# Patient Record
Sex: Male | Born: 1955
Health system: Southern US, Community
[De-identification: ages and names within clinical notes are randomized; demographics above are authoritative.]

## PROBLEM LIST (undated history)

## (undated) DIAGNOSIS — Z9889 Other specified postprocedural states: Secondary | ICD-10-CM

## (undated) DIAGNOSIS — J189 Pneumonia, unspecified organism: Secondary | ICD-10-CM

## (undated) DIAGNOSIS — Z8709 Personal history of other diseases of the respiratory system: Secondary | ICD-10-CM

## (undated) DIAGNOSIS — Z860100 Personal history of colon polyps, unspecified: Secondary | ICD-10-CM

## (undated) DIAGNOSIS — T7840XA Allergy, unspecified, initial encounter: Secondary | ICD-10-CM

## (undated) DIAGNOSIS — E785 Hyperlipidemia, unspecified: Secondary | ICD-10-CM

## (undated) DIAGNOSIS — R112 Nausea with vomiting, unspecified: Secondary | ICD-10-CM

## (undated) DIAGNOSIS — I10 Essential (primary) hypertension: Secondary | ICD-10-CM

## (undated) DIAGNOSIS — K219 Gastro-esophageal reflux disease without esophagitis: Secondary | ICD-10-CM

## (undated) HISTORY — PX: POLYPECTOMY: SHX149

## (undated) HISTORY — PX: COLONOSCOPY: SHX174

## (undated) HISTORY — PX: TONSILLECTOMY: SUR1361

## (undated) HISTORY — DX: Essential (primary) hypertension: I10

## (undated) HISTORY — DX: Hyperlipidemia, unspecified: E78.5

## (undated) SURGERY — VIDEO BRONCHOSCOPY WITHOUT FLUORO
Anesthesia: General

---

## 1996-02-16 HISTORY — PX: ELBOW FRACTURE SURGERY: SHX616

## 1998-01-15 ENCOUNTER — Emergency Department (HOSPITAL_COMMUNITY): Admission: EM | Admit: 1998-01-15 | Discharge: 1998-01-15 | Payer: Self-pay | Admitting: Emergency Medicine

## 1998-01-15 ENCOUNTER — Encounter: Payer: Self-pay | Admitting: Emergency Medicine

## 1998-08-02 ENCOUNTER — Encounter: Payer: Self-pay | Admitting: *Deleted

## 1998-08-02 ENCOUNTER — Emergency Department (HOSPITAL_COMMUNITY): Admission: EM | Admit: 1998-08-02 | Discharge: 1998-08-02 | Payer: Self-pay | Admitting: Emergency Medicine

## 1999-01-05 ENCOUNTER — Encounter: Admission: RE | Admit: 1999-01-05 | Discharge: 1999-01-05 | Payer: Self-pay | Admitting: Neurosurgery

## 1999-09-21 ENCOUNTER — Ambulatory Visit (HOSPITAL_COMMUNITY): Admission: RE | Admit: 1999-09-21 | Discharge: 1999-09-21 | Payer: Self-pay | Admitting: Neurosurgery

## 1999-09-21 ENCOUNTER — Encounter: Payer: Self-pay | Admitting: Neurosurgery

## 1999-10-05 ENCOUNTER — Encounter: Payer: Self-pay | Admitting: Neurosurgery

## 1999-10-05 ENCOUNTER — Ambulatory Visit (HOSPITAL_COMMUNITY): Admission: RE | Admit: 1999-10-05 | Discharge: 1999-10-05 | Payer: Self-pay | Admitting: Neurosurgery

## 1999-10-20 ENCOUNTER — Ambulatory Visit (HOSPITAL_COMMUNITY): Admission: RE | Admit: 1999-10-20 | Discharge: 1999-10-20 | Payer: Self-pay | Admitting: Neurosurgery

## 1999-10-20 ENCOUNTER — Encounter: Payer: Self-pay | Admitting: Neurosurgery

## 2000-11-06 ENCOUNTER — Encounter: Payer: Self-pay | Admitting: Neurosurgery

## 2000-11-06 ENCOUNTER — Ambulatory Visit (HOSPITAL_COMMUNITY): Admission: RE | Admit: 2000-11-06 | Discharge: 2000-11-06 | Payer: Self-pay | Admitting: Neurosurgery

## 2004-03-13 ENCOUNTER — Ambulatory Visit: Payer: Self-pay | Admitting: Internal Medicine

## 2004-03-26 ENCOUNTER — Ambulatory Visit: Payer: Self-pay | Admitting: Internal Medicine

## 2008-02-16 HISTORY — PX: ROTATOR CUFF REPAIR: SHX139

## 2008-08-09 ENCOUNTER — Encounter: Admission: RE | Admit: 2008-08-09 | Discharge: 2008-08-09 | Payer: Self-pay | Admitting: Orthopaedic Surgery

## 2009-03-21 ENCOUNTER — Encounter (INDEPENDENT_AMBULATORY_CARE_PROVIDER_SITE_OTHER): Payer: Self-pay | Admitting: *Deleted

## 2009-10-08 ENCOUNTER — Encounter (INDEPENDENT_AMBULATORY_CARE_PROVIDER_SITE_OTHER): Payer: Self-pay | Admitting: *Deleted

## 2009-10-08 ENCOUNTER — Telehealth: Payer: Self-pay | Admitting: Internal Medicine

## 2009-11-20 ENCOUNTER — Encounter (INDEPENDENT_AMBULATORY_CARE_PROVIDER_SITE_OTHER): Payer: Self-pay | Admitting: *Deleted

## 2009-11-21 ENCOUNTER — Ambulatory Visit: Payer: Self-pay | Admitting: Internal Medicine

## 2009-12-05 ENCOUNTER — Ambulatory Visit: Payer: Self-pay | Admitting: Internal Medicine

## 2009-12-05 HISTORY — PX: COLONOSCOPY: SHX174

## 2009-12-10 ENCOUNTER — Encounter: Payer: Self-pay | Admitting: Internal Medicine

## 2010-03-17 NOTE — Procedures (Signed)
Summary: Colonoscopy  Patient: Robert Morrison Note: All result statuses are Final unless otherwise noted.  Tests: (1) Colonoscopy (COL)   COL Colonoscopy           DONE     [facility]     [address]     [place], [place]  [address]           COLONOSCOPY PROCEDURE REPORT           PATIENT:  [name], [name]  MR#:  [id number]     BIRTHDATE:  [birth year], 53 yrs. old  GENDER:  male     ENDOSCOPIST:  Wilhemina Bonito. Eda Keys, MD     REF. BY:  Surveillance Program Recall,     PROCEDURE DATE:  12/05/2009     PROCEDURE:  Colonoscopy with snare polypectomy x 2     ASA CLASS:  Class I     INDICATIONS:  history of pre-cancerous (adenomatous) colon polyps,     surveillance and high-risk screening ; index 03-2004 w/ small     adenoma     MEDICATIONS:   Fentanyl 100 mcg IV, Versed 10 mg IV           DESCRIPTION OF PROCEDURE:   After the risks benefits and     alternatives of the procedure were thoroughly explained, informed     consent was obtained.  Digital rectal exam was performed and     revealed no abnormalities.   The LB CF-[id number] endoscope     was introduced through the anus and advanced to the cecum, which     was identified by both the appendix and ileocecal valve, without     limitations.Time to cecum = 2:15 min.  The quality of the prep was     excellent, using MoviPrep.  The instrument was then slowly     withdrawn (time = 13:38 min) as the colon was fully examined.     <<PROCEDUREIMAGES>>           FINDINGS:  A 5mm sessile polyp was found in the cecum. Polyp was     snared without cautery. Retrieval was successful.   A diminutive     polyp was found in the sigmoid colon. Polyp was snared without     cautery. Retrieval was successful.   This was otherwise a normal     examination of the colon.   Retroflexed views in the rectum     revealed no abnormalities.    The scope was then withdrawn from     the patient and the procedure completed.           COMPLICATIONS:   None     ENDOSCOPIC IMPRESSION:     1) Sessile polyp in the cecum - removed     2) Diminutive polyp in the sigmoid colon - removed     3) Otherwise normal examination           RECOMMENDATIONS:     1) Follow up colonoscopy in 5 years           ______________________________     Wilhemina Bonito. Eda Keys, MD           CC:  Creola Corn, MD;The Patient           n.     Rosalie DoctorWilhemina Bonito. Eda Keys at 12/05/2009 03:38 PM           Candis Musa, [id number]  Note: An exclamation mark Marland Kitchen)  indicates a result that was not dispersed into the flowsheet. Document Creation Date: 12/05/2009 3:39 PM _______________________________________________________________________  (1) Order result status: Final Collection or observation date-time: 12/05/2009 15:32 Requested date-time:  Receipt date-time:  Reported date-time:  Referring Physician:   Ordering Physician: Fransico Setters [phone]) Specimen Source:  Source: Launa Grill Order Number: [id number] Lab site:   Appended Document: Colonoscopy recall 5 yrs     Procedures Next Due Date:    Colonoscopy: 11/2014

## 2010-03-17 NOTE — Miscellaneous (Signed)
Summary: [facility]/prep  Clinical Lists Changes  Medications: Added new medication of MOVIPREP 100 GM  SOLR (PEG-KCL-NACL-NASULF-NA ASC-C) As per prep instructions. - Signed Rx of MOVIPREP 100 GM  SOLR (PEG-KCL-NACL-NASULF-NA ASC-C) As per prep instructions.;  #1 x 0;  Signed;  Entered by: Wyona Almas RN;  Authorized by: Hilarie Fredrickson MD;  Method used: Electronically to [facility] # [id number]*, [address] [place], [place], [place]  [address], Ph: [phone] or [phone], Fax: [phone] Allergies: Added new allergy or adverse reaction of VICODIN Observations: Added new observation of NKA: F (11/21/2009 14:37)    Prescriptions: MOVIPREP 100 GM  SOLR (PEG-KCL-NACL-NASULF-NA ASC-C) As per prep instructions.  #1 x 0   Entered by:   Wyona Almas RN   Authorized by:   Hilarie Fredrickson MD   Signed by:   Wyona Almas RN on 11/21/2009   Method used:   Electronically to        [facility] [address] [id number]* (retail)       [address]       [place], [place]  [address]       Ph: [phone] or [phone]       Fax: [phone]   RxID:   [id number]

## 2010-03-17 NOTE — Letter (Signed)
Summary: Previsit letter  [facility]  [address] [place], [place] [address]   Phone: [phone]  Fax: [phone]       10/08/2009 MRN: [id number]  [facility] [address] [place], [place]  [address]  Dear Robert Morrison,  Welcome to the Gastroenterology Division at [facility].    You are scheduled to see a nurse for your pre-procedure visit on 11/21/2009 at 3:00pm  on the 3rd floor at [facility], [address]  We ask that you try to arrive at our office 15 minutes prior to your appointment time to allow for check-in.  Your nurse visit will consist of discussing your medical and surgical history, your immediate family medical history, and your medications.    Please bring a complete list of all your medications or, if you prefer, bring the medication bottles and we will list them.  We will need to be aware of both prescribed and over the counter drugs.  We will need to know exact dosage information as well.  If you are on blood thinners (Coumadin, Plavix, Aggrenox, Ticlid, etc.) please call our office today/prior to your appointment, as we need to consult with your physician about holding your medication.   Please be prepared to read and sign documents such as consent forms, a financial agreement, and acknowledgement forms.  If necessary, and with your consent, a friend or relative is welcome to sit-in on the nurse visit with you.  Please bring your insurance card so that we may make a copy of it.  If your insurance requires a referral to see a specialist, please bring your referral form from your primary care physician.  No co-pay is required for this nurse visit.     If you cannot keep your appointment, please call [phone] to cancel or reschedule prior to your appointment date.  This allows [place] the opportunity to schedule an appointment for another patient in need of care.    Thank you for choosing [facility] for your medical  needs.  We appreciate the opportunity to care for you.  Please visit [place] at our website  to learn more about our practice.                     Sincerely.                                                                                                                   The Gastroenterology Division

## 2010-03-17 NOTE — Letter (Signed)
Summary: [facility] Instructions  [facility]  [address] [place], [place] [address]   Phone: [phone]  Fax: [phone]       Robert Morrison    1955-12-09    MRN: [id number]        Procedure Day Dorna Bloom:  Farrell Ours  12/05/09     Arrival Time:  1:30PM      Procedure Time:  2:30PM     Location of Procedure:                    _X _  [facility] (4th Floor)                      PREPARATION FOR COLONOSCOPY WITH MOVIPREP   Starting 5 days prior to your procedure 11/30/09 do not eat nuts, seeds, popcorn, corn, beans, peas,  salads, or any raw vegetables.  Do not take any fiber supplements (e.g. Metamucil, Citrucel, and Benefiber).  THE DAY BEFORE YOUR PROCEDURE         DATE: 12/04/09  DAY: THURSDAY  1.  Drink clear liquids the entire day-NO SOLID FOOD  2.  Do not drink anything colored red or purple.  Avoid juices with pulp.  No orange juice.  3.  Drink at least 64 oz. (8 glasses) of fluid/clear liquids during the day to prevent dehydration and help the prep work efficiently.  CLEAR LIQUIDS INCLUDE: Water Jello Ice Popsicles Tea (sugar ok, no milk/cream) Powdered fruit flavored drinks Coffee (sugar ok, no milk/cream) Gatorade Juice: apple, white grape, white cranberry  Lemonade Clear bullion, consomm, broth Carbonated beverages (any kind) Strained chicken noodle soup Hard Candy                             4.  In the morning, mix first dose of MoviPrep solution:    Empty 1 Pouch A and 1 Pouch B into the disposable container    Add lukewarm drinking water to the top line of the container. Mix to dissolve    Refrigerate (mixed solution should be used within 24 hrs)  5.  Begin drinking the prep at 5:00 p.m. The MoviPrep container is divided by 4 marks.   Every 15 minutes drink the solution down to the next mark (approximately 8 oz) until the full liter is complete.   6.  Follow completed prep with 16 oz of clear liquid of your choice  (Nothing red or purple).  Continue to drink clear liquids until bedtime.  7.  Before going to bed, mix second dose of MoviPrep solution:    Empty 1 Pouch A and 1 Pouch B into the disposable container    Add lukewarm drinking water to the top line of the container. Mix to dissolve    Refrigerate  THE DAY OF YOUR PROCEDURE      DATE: 12/05/09  DAY: FRIDAY  Beginning at 9:30PM (5 hours before procedure):         1. Every 15 minutes, drink the solution down to the next mark (approx 8 oz) until the full liter is complete.  2. Follow completed prep with 16 oz. of clear liquid of your choice.    3. You may drink clear liquids until 12:30PM (2 HOURS BEFORE PROCEDURE).   MEDICATION INSTRUCTIONS  Unless otherwise instructed, you should take regular prescription medications with a small sip of water   as early as possible the morning of  your procedure.      OTHER INSTRUCTIONS  You will need a responsible adult at least 57 years of age to accompany you and drive you home.   This person must remain in the waiting room during your procedure.  Wear loose fitting clothing that is easily removed.  Leave jewelry and other valuables at home.  However, you may wish to bring a book to read or  an iPod/MP3 player to listen to music as you wait for your procedure to start.  Remove all body piercing jewelry and leave at home.  Total time from sign-in until discharge is approximately 2-3 hours.  You should go home directly after your procedure and rest.  You can resume normal activities the  day after your procedure.  The day of your procedure you should not:   Drive   Make legal decisions   Operate machinery   Drink alcohol   Return to work  You will receive specific instructions about eating, activities and medications before you leave.    The above instructions have been reviewed and explained to me by   Wyona Almas RN  November 21, 2009 3:45 PM     I fully understand  and can verbalize these instructions _____________________________ Date _________

## 2010-03-17 NOTE — Letter (Signed)
Summary: Patient Notice- Polyp Results  [facility]  [address] [place], [place] [address]   Phone: [phone]  Fax: [phone]        December 10, 2009 MRN: [id number]    LEAH SKORA [address] [place], [place]  [address]    Dear Mr. KNACK,  I am pleased to inform you that the colon polyp(s) removed during your recent colonoscopy was (were) found to be benign (no cancer detected) upon pathologic examination.  I recommend you have a repeat colonoscopy examination in 5 years to look for recurrent polyps, as having colon polyps increases your risk for having recurrent polyps or even colon cancer in the future.  Should you develop new or worsening symptoms of abdominal pain, bowel habit changes or bleeding from the rectum or bowels, please schedule an evaluation with either your primary care physician or with me.    Additional information/recommendations:  __ No further action with gastroenterology is needed at this time. Please      follow-up with your primary care physician for your other healthcare      needs.    Please call us if you are having persistent problems or have questions about your condition that have not been fully answered at this time.  Sincerely,  Hilarie Fredrickson MD  This letter has been electronically signed by your physician.  Appended Document: Patient Notice- Polyp Results letter mailed 10.28.11

## 2010-03-17 NOTE — Progress Notes (Signed)
Summary: Schedule Colonoscopy  Phone Note Outgoing Call Call back at [facility] Phone [phone]   Call placed by: Harlow Mares CMA Duncan Dull),  October 08, 2009 9:08 AM Call placed to: Patient Summary of Call: Left message on patients machine to call back. patient is due for a colonoscopy Initial call taken by: Harlow Mares CMA Duncan Dull),  October 08, 2009 9:08 AM  Follow-up for Phone Call        Pt scheduled colon for 12/05/09 Follow-up by: Harlow Mares CMA Duncan Dull),  October 08, 2009 4:25 PM

## 2010-03-17 NOTE — Letter (Signed)
Summary: Colonoscopy Letter  [facility]  [address] [place], [place] [address]   Phone: [phone]  Fax: [phone]      March 21, 2009 MRN: [id number]   BRAELYNN LUPTON [address] [place], [place]  [address]   Dear Mr. SENGER,   According to your medical record, it is time for you to schedule a Colonoscopy. The American Cancer Society recommends this procedure as a method to detect early colon cancer. Patients with a family history of colon cancer, or a personal history of colon polyps or inflammatory bowel disease are at increased risk.  This letter has beeen generated based on the recommendations made at the time of your procedure. If you feel that in your particular situation this may no longer apply, please contact our office.  Please call our office at [phone] to schedule this appointment or to update your records at your earliest convenience.  Thank you for cooperating with [place] to provide you with the very best care possible.   Sincerely,  Wilhemina Bonito. Marina Goodell, M.D  [facility] [phone]

## 2014-02-15 HISTORY — PX: COLONOSCOPY: SHX174

## 2014-02-15 HISTORY — PX: POLYPECTOMY: SHX149

## 2014-10-16 ENCOUNTER — Encounter: Payer: Self-pay | Admitting: Internal Medicine

## 2014-12-06 ENCOUNTER — Ambulatory Visit (AMBULATORY_SURGERY_CENTER): Payer: Self-pay

## 2014-12-06 VITALS — Ht 64.0 in | Wt 171.0 lb

## 2014-12-06 DIAGNOSIS — Z86010 Personal history of colonic polyps: Secondary | ICD-10-CM

## 2014-12-06 MED ORDER — NA SULFATE-K SULFATE-MG SULF 17.5-3.13-1.6 GM/177ML PO SOLN: 1.0000 | Freq: Once | ORAL | Status: DC

## 2014-12-06 NOTE — Progress Notes (Signed)
No egg or soy allergies Not on home 02 No previous anesthesia complications No diet or weight loss meds 

## 2014-12-20 ENCOUNTER — Ambulatory Visit (AMBULATORY_SURGERY_CENTER): Payer: 59 | Admitting: Internal Medicine

## 2014-12-20 ENCOUNTER — Encounter: Payer: Self-pay | Admitting: Internal Medicine

## 2014-12-20 VITALS — BP 133/75 | HR 65 | Temp 97.7°F | Resp 20 | Ht 64.0 in | Wt 171.0 lb

## 2014-12-20 DIAGNOSIS — D123 Benign neoplasm of transverse colon: Secondary | ICD-10-CM

## 2014-12-20 DIAGNOSIS — Z86010 Personal history of colonic polyps: Secondary | ICD-10-CM | POA: Diagnosis present

## 2014-12-20 MED ORDER — SODIUM CHLORIDE 0.9 % IV SOLN: 500.0000 mL | INTRAVENOUS | Status: DC

## 2014-12-20 NOTE — Patient Instructions (Addendum)
YOU HAD AN ENDOSCOPIC PROCEDURE TODAY AT THE [facility]:   Refer to the procedure report that was given to you for any specific questions about what was found during the examination.  If the procedure report does not answer your questions, please call your gastroenterologist to clarify.  If you requested that your care partner not be given the details of your procedure findings, then the procedure report has been included in a sealed envelope for you to review at your convenience later.  YOU SHOULD EXPECT: Some feelings of bloating in the abdomen. Passage of more gas than usual.  Walking can help get rid of the air that was put into your GI tract during the procedure and reduce the bloating. If you had a lower endoscopy (such as a colonoscopy or flexible sigmoidoscopy) you may notice spotting of blood in your stool or on the toilet paper. If you underwent a bowel prep for your procedure, you may not have a normal bowel movement for a few days.  Please Note:  You might notice some irritation and congestion in your nose or some drainage.  This is from the oxygen used during your procedure.  There is no need for concern and it should clear up in a day or so.  SYMPTOMS TO REPORT IMMEDIATELY:   Following lower endoscopy (colonoscopy or flexible sigmoidoscopy):  Excessive amounts of blood in the stool  Significant tenderness or worsening of abdominal pains  Swelling of the abdomen that is new, acute  Fever of 100F or higher   For urgent or emergent issues, a gastroenterologist can be reached at any hour by calling [phone].   DIET: Your first meal following the procedure should be a small meal and then it is ok to progress to your normal diet. Heavy or fried foods are harder to digest and may make you feel nauseous or bloated.  Likewise, meals heavy in dairy and vegetables can increase bloating.  Drink plenty of fluids but you should avoid alcoholic beverages for 24  hours.  ACTIVITY:  You should plan to take it easy for the rest of today and you should NOT DRIVE or use heavy machinery until tomorrow (because of the sedation medicines used during the test).    FOLLOW UP: Our staff will call the number listed on your records the next business day following your procedure to check on you and address any questions or concerns that you may have regarding the information given to you following your procedure. If we do not reach you, we will leave a message.  However, if you are feeling well and you are not experiencing any problems, there is no need to return our call.  We will assume that you have returned to your regular daily activities without incident.  If any biopsies were taken you will be contacted by phone or by letter within the next 1-3 weeks.  Please call us at [phone] if you have not heard about the biopsies in 3 weeks.    SIGNATURES/CONFIDENTIALITY: You and/or your care partner have signed paperwork which will be entered into your electronic medical record.  These signatures attest to the fact that that the information above on your After Visit Summary has been reviewed and is understood.  Full responsibility of the confidentiality of this discharge information lies with you and/or your care-partner.  Read all handouts given to you by your recovery room nurse. 

## 2014-12-20 NOTE — Progress Notes (Signed)
Called to room to assist during endoscopic procedure.  Patient ID and intended procedure confirmed with present staff. Received instructions for my participation in the procedure from the performing physician.  

## 2014-12-20 NOTE — Progress Notes (Signed)
Report to PACU, RN, vss, BBS= Clear.  

## 2014-12-20 NOTE — Op Note (Signed)
[place]  [address] [place], [address]   COLONOSCOPY PROCEDURE REPORT  PATIENT: Robert Morrison, Robert Morrison  MR#: [id number] BIRTHDATE: 05/27/1955 , 26  yrs. old GENDER: male ENDOSCOPIST: Eustace Quail, MD REFERRED OM:[id number] Program Recall PROCEDURE DATE:  12/20/2014 PROCEDURE:   Colonoscopy, surveillance and Colonoscopy with snare polypectomy x 1 First Screening Colonoscopy - Avg.  risk and is 50 yrs.  old or older - No.  Prior Negative Screening - Now for repeat screening. N/A  History of Adenoma - Now for follow-up colonoscopy & has been > or = to 3 yrs.  Yes hx of adenoma.  Has been 3 or more years since last colonoscopy.  Polyps removed today? Yes ASA CLASS:   Class II INDICATIONS:Surveillance due to prior colonic neoplasia and PH Colon Adenoma.  . Index examination 2006 (TA); follow-up October 2011 (sessile serrated polyp) MEDICATIONS: Monitored anesthesia care and Propofol 200 mg IV  DESCRIPTION OF PROCEDURE:   After the risks benefits and alternatives of the procedure were thoroughly explained, informed consent was obtained.  The digital rectal exam revealed no abnormalities of the rectum.   The LB [id number]  endoscope was introduced through the anus and advanced to the cecum, which was identified by both the appendix and ileocecal valve. No adverse events experienced.   The quality of the prep was excellent. (Suprep was used)  The instrument was then slowly withdrawn as the colon was fully examined. Estimated blood loss is zero unless otherwise noted in this procedure report.    COLON FINDINGS: A single polyp measuring 5 mm in size was found in the transverse colon.  A polypectomy was performed with a cold snare.  The resection was complete, the polyp tissue was completely retrieved and sent to histology.   The examination was otherwise normal.  Retroflexed views revealed no abnormalities. The time to cecum = 1.8 Withdrawal time = 9.2    The scope was withdrawn and the procedure completed. COMPLICATIONS: There were no immediate complications.  ENDOSCOPIC IMPRESSION: 1.   Single polyp was found in the transverse colon; polypectomy was performed with a cold snare 2.   The examination was otherwise normal  RECOMMENDATIONS: 1. Repeat colonoscopy in 5 years if polyp adenomatous; otherwise 10 years  eSigned:  Eustace Quail, MD 12/20/2014 11:46 AM   cc: The Patient and Shon Baton, MD

## 2014-12-23 ENCOUNTER — Telehealth: Payer: Self-pay | Admitting: *Deleted

## 2014-12-23 ENCOUNTER — Encounter: Payer: Self-pay | Admitting: Internal Medicine

## 2014-12-23 NOTE — Telephone Encounter (Signed)
  Follow up Call-  Call back number 12/20/2014  Post procedure Call Back phone  # [phone]  Permission to leave phone message Yes     Patient questions:  Do you have a fever, pain , or abdominal swelling? No. Pain Score  0 *  Have you tolerated food without any problems? Yes.    Have you been able to return to your normal activities? Yes.    Do you have any questions about your discharge instructions: Diet   No. Medications  No. Follow up visit  No.  Do you have questions or concerns about your Care? No.  Actions: * If pain score is 4 or above: No action needed, pain <4.

## 2015-07-18 ENCOUNTER — Encounter: Payer: Self-pay | Admitting: Internal Medicine

## 2015-10-21 ENCOUNTER — Other Ambulatory Visit (HOSPITAL_COMMUNITY): Payer: Self-pay | Admitting: Respiratory Therapy

## 2015-10-21 DIAGNOSIS — R05 Cough: Secondary | ICD-10-CM

## 2015-10-21 DIAGNOSIS — R053 Chronic cough: Secondary | ICD-10-CM

## 2015-10-31 ENCOUNTER — Ambulatory Visit (HOSPITAL_COMMUNITY)
Admission: RE | Admit: 2015-10-31 | Discharge: 2015-10-31 | Disposition: A | Discharge status: Home / Self Care | Payer: 59 | Source: Ambulatory Visit | Attending: Internal Medicine | Admitting: Internal Medicine

## 2015-10-31 DIAGNOSIS — R05 Cough: Secondary | ICD-10-CM | POA: Insufficient documentation

## 2015-11-02 LAB — PULMONARY FUNCTION TEST
DL/VA % pred: 122 %
DL/VA: 5.28 ml/min/mmHg/L
DLCO UNC % PRED: 93 %
DLCO unc: 24.66 ml/min/mmHg
FEF 25-75 PRE: 4.01 L/s
FEF2575-%PRED-PRE: 162 %
FEV1-%PRED-PRE: 101 %
FEV1-PRE: 3.05 L
FEV1FVC-%PRED-PRE: 113 %
FEV6-%Pred-Pre: 94 %
FEV6-Pre: 3.55 L
FEV6FVC-%Pred-Pre: 104 %
FVC-%PRED-PRE: 89 %
FVC-PRE: 3.57 L
PRE FEV1/FVC RATIO: 85 %
Pre FEV6/FVC Ratio: 99 %
RV % pred: 46 %
RV: 0.95 L
TLC % pred: 73 %
TLC: 4.45 L

## 2015-11-12 ENCOUNTER — Encounter: Payer: Self-pay | Admitting: Pulmonary Disease

## 2015-11-12 ENCOUNTER — Ambulatory Visit (INDEPENDENT_AMBULATORY_CARE_PROVIDER_SITE_OTHER): Payer: 59 | Admitting: Pulmonary Disease

## 2015-11-12 VITALS — BP 136/82 | HR 68 | Ht 65.0 in | Wt 178.0 lb

## 2015-11-12 DIAGNOSIS — R05 Cough: Secondary | ICD-10-CM | POA: Diagnosis not present

## 2015-11-12 DIAGNOSIS — R059 Cough, unspecified: Secondary | ICD-10-CM | POA: Insufficient documentation

## 2015-11-12 LAB — NITRIC OXIDE: Nitric Oxide: 15

## 2015-11-12 NOTE — Assessment & Plan Note (Signed)
Robert Morrison has been having a cough for the last 3 months which started insidiously. He had some improvement with prednisone on 2 separate occasions but the symptoms still persist. It's associated with an abnormal sensation in his chest. He said to negative chest x-rays but his lung function testing showed restrictive lung disease. On physical exam he has crackles in the left base.  I think the most likely cause of his cough is acid reflux with continued laryngeal irritation perpetuating the cough. However, based on his abnormalities on lung function testing and physical exam I do worry about the possibility of either recent pneumonia or worse yet pulmonary fibrosis.  Plan: High-resolution CT scan of the chest Treat gastroesophageal reflux disease empirically with lifestyle modifications and Pepcid twice a day Voice rest was strongly encouraged and he was advised to use Delsym over-the-counter Exhaled nitric oxide testing today was performed: Normal Follow-up in 2 weeks to go over the results of a high-risk CT and see how he is doing on acid reflux therapy

## 2015-11-12 NOTE — Progress Notes (Signed)
Subjective:    Patient ID: Robert Morrison, male    DOB: Feb 09, 1956, 60 y.o.   MRN: [id number]  HPI Chief Complaint  Patient presents with  . Advice Only    Referred by Dr. Virgina Jock for persistent nonprod cough X3 months.    Robert Morrison has been coughing for months.  He said that he has seen his PCP for this and some treatments would help for about a week and this would help, but then the cough comes back again.  He was treated with a prednisone taper and given antibiotics and tessalon perles.  He felt well initially while taking this, but then the cough came back so he was put back on prednisone and tessalon perles.  This helped again, then the cough came back again when he completed therapy.    He says it occurs all day, is dry, and lasts for up to 15 minutes at a time.  He says it started insiduously without a cold or upper respiratory infection.  He says he feels something unusual in his chest to throat area.  He has the need to cough.  He has been clearing his throat a lot for this. The cough occurs all day, can occur at rest, while working.  He says that lying down is not a problem.     He has never had this before.   He has not had a change in his work or home environment recently.  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  He will work with some aerosol type clearners from time to time.  No changes since 2006.  He has never smoked cigarettes, long time ago he smoked a pipe rarely.    No heartburn or indigestion, but he has the "weird feeling" in his chest.  He doesn't have sinus congestion or post nasal drip.     Past Medical History:  Diagnosis Date  . Hyperlipidemia   . Hypertension      Family History  Problem Relation Age of Onset  . Colon polyps Sister   . Colon cancer Neg Hx      Social History   Social History  . Marital status: Married    Spouse name: N/A  . Number of children: N/A    . Years of education: N/A   Occupational History  . Not on file.   Social History Main Topics  . Smoking status: Never Smoker  . Smokeless tobacco: Never Used  . Alcohol use 0.0 oz/week     Comment: rarely  . Drug use: No  . Sexual activity: Not on file   Other Topics Concern  . Not on file   Social History Narrative  . No narrative on file     Allergies  Allergen Reactions  . Hydrocodone-Acetaminophen     REACTION: rash     No outpatient prescriptions prior to visit.   No facility-administered medications prior to visit.       Review of Systems  Constitutional: Negative for fever and unexpected weight change.  HENT: Positive for congestion. Negative for dental problem, ear pain, nosebleeds, postnasal drip, rhinorrhea, sinus pressure, sneezing, sore throat and trouble swallowing.   Eyes: Negative for redness and itching.  Respiratory: Positive for cough. Negative for chest tightness, shortness of breath and wheezing.   Cardiovascular: Negative for palpitations and leg swelling.  Gastrointestinal: Negative for nausea and vomiting.  Genitourinary: Negative for dysuria.  Musculoskeletal: Negative  for joint swelling.  Skin: Negative for rash.  Neurological: Negative for headaches.  Hematological: Does not bruise/bleed easily.  Psychiatric/Behavioral: Negative for dysphoric mood. The patient is not nervous/anxious.        Objective:   Physical Exam Vitals:   11/12/15 1335  BP: 136/82  Pulse: 68  SpO2: 97%  Weight: 178 lb (80.7 kg)  Height: 5\' 5"  (1.651 m)   Gen: well appearing, no acute distress HENT: NCAT, OP clear, neck supple without masses Eyes: PERRL, EOMi Lymph: no cervical lymphadenopathy PULM: Crackles left lower lobe, normal air movement  CV: RRR, no mgr, no JVD GI: BS+, soft, nontender, no hsm Derm: no rash or skin breakdown MSK: normal bulk and tone Neuro: A&Ox4, CN II-XII intact, strength 5/5 in all 4 extremities Psyche: normal mood and  affect  September 2017 pulmonary 5 and testing ratio 85%, FEV1 3.05 L, FVC 3.57 L 89% predicted, total lung capacity 4.45 L 73% predicted, DLCO 24.6 693% predicted  No other records available for me to review  Exhaled nitric oxide was performed today: 15 ppm      Assessment & Plan:  Cough Mr. Nodal has been having a cough for the last 3 months which started insidiously. He had some improvement with prednisone on 2 separate occasions but the symptoms still persist. It's associated with an abnormal sensation in his chest. He said to negative chest x-rays but his lung function testing showed restrictive lung disease. On physical exam he has crackles in the left base.  I think the most likely cause of his cough is acid reflux with continued laryngeal irritation perpetuating the cough. However, based on his abnormalities on lung function testing and physical exam I do worry about the possibility of either recent pneumonia or worse yet pulmonary fibrosis.  Plan: High-resolution CT scan of the chest Treat gastroesophageal reflux disease empirically with lifestyle modifications and Pepcid twice a day Voice rest was strongly encouraged and he was advised to use Delsym over-the-counter Exhaled nitric oxide testing today was performed: Normal Follow-up in 2 weeks to go over the results of a high-risk CT and see how he is doing on acid reflux therapy   No current outpatient prescriptions on file.

## 2015-11-12 NOTE — Patient Instructions (Addendum)
We will arrange for a CT scan of your chest in call you with the results  Follow the acid reflux guideline sheet we gave you Take the antacid Pepcid OTC twice a day  You need to try to suppress your cough to allow your larynx (voice box) to heal.  For three days don't talk, laugh, sing, or clear your throat. Do everything you can to suppress the cough during this time. Use hard candies (sugarless Jolly Ranchers) or non-mint or non-menthol containing cough drops during this time to soothe your throat.  Use a cough suppressant (Delsym) around the clock during this time.  After three days, gradually increase the use of your voice and back off on the cough suppressants.  We will see you back in 2 weeks with our nurse practitioner or sooner if needed

## 2015-11-20 ENCOUNTER — Ambulatory Visit (INDEPENDENT_AMBULATORY_CARE_PROVIDER_SITE_OTHER)
Admission: RE | Admit: 2015-11-20 | Discharge: 2015-11-20 | Disposition: A | Discharge status: Home / Self Care | Payer: 59 | Source: Ambulatory Visit | Attending: Pulmonary Disease | Admitting: Pulmonary Disease

## 2015-11-20 DIAGNOSIS — R059 Cough, unspecified: Secondary | ICD-10-CM

## 2015-11-20 DIAGNOSIS — R05 Cough: Secondary | ICD-10-CM | POA: Diagnosis not present

## 2015-11-21 ENCOUNTER — Telehealth: Payer: Self-pay | Admitting: Pulmonary Disease

## 2015-11-21 NOTE — Telephone Encounter (Signed)
Spoke with patient-aware that BQ is back in the office on Monday 11-24-15. Aware that I am sending phone message to BQ and his nurse so they are aware that pt would like a call back Monday for results.

## 2015-11-24 NOTE — Telephone Encounter (Signed)
Per BQ pt will need a biopsy d/t swollen lymph nodes in chest.  This will be scheduled at his OV on 10/13 with TP.   Spoke with pt, aware of recs.   Nothing further needed.

## 2015-11-24 NOTE — Telephone Encounter (Signed)
[phone] pt calling back for results

## 2015-11-24 NOTE — Telephone Encounter (Signed)
Robert Morrison working on arranging a visit.

## 2015-11-28 ENCOUNTER — Encounter: Payer: Self-pay | Admitting: Adult Health

## 2015-11-28 ENCOUNTER — Ambulatory Visit (INDEPENDENT_AMBULATORY_CARE_PROVIDER_SITE_OTHER): Payer: 59 | Admitting: Adult Health

## 2015-11-28 ENCOUNTER — Other Ambulatory Visit (INDEPENDENT_AMBULATORY_CARE_PROVIDER_SITE_OTHER): Payer: 59

## 2015-11-28 VITALS — BP 130/88 | HR 73 | Ht 66.5 in | Wt 171.2 lb

## 2015-11-28 DIAGNOSIS — R059 Cough, unspecified: Secondary | ICD-10-CM

## 2015-11-28 DIAGNOSIS — R590 Localized enlarged lymph nodes: Secondary | ICD-10-CM | POA: Diagnosis not present

## 2015-11-28 DIAGNOSIS — R05 Cough: Secondary | ICD-10-CM

## 2015-11-28 DIAGNOSIS — D860 Sarcoidosis of lung: Secondary | ICD-10-CM | POA: Insufficient documentation

## 2015-11-28 LAB — CBC WITH DIFFERENTIAL/PLATELET
BASOS ABS: 0 10*3/uL (ref 0.0–0.1)
Basophils Relative: 0.2 % (ref 0.0–3.0)
EOS ABS: 0.1 10*3/uL (ref 0.0–0.7)
Eosinophils Relative: 1.5 % (ref 0.0–5.0)
HEMATOCRIT: 47.7 % (ref 39.0–52.0)
HEMOGLOBIN: 16.1 g/dL (ref 13.0–17.0)
LYMPHS PCT: 11.5 % — AB (ref 12.0–46.0)
Lymphs Abs: 0.9 10*3/uL (ref 0.7–4.0)
MCHC: 33.9 g/dL (ref 30.0–36.0)
MCV: 79.8 fl (ref 78.0–100.0)
MONOS PCT: 6.9 % (ref 3.0–12.0)
Monocytes Absolute: 0.6 10*3/uL (ref 0.1–1.0)
NEUTROS ABS: 6.5 10*3/uL (ref 1.4–7.7)
Neutrophils Relative %: 79.9 % — ABNORMAL HIGH (ref 43.0–77.0)
PLATELETS: 189 10*3/uL (ref 150.0–400.0)
RBC: 5.98 Mil/uL — AB (ref 4.22–5.81)
RDW: 14.5 % (ref 11.5–15.5)
WBC: 8.2 10*3/uL (ref 4.0–10.5)

## 2015-11-28 NOTE — Assessment & Plan Note (Addendum)
Cough with mediastinal /hilar lymphadenopathy with lung nodules - this could be possible Sarcoid . Will need to proceed with biopsy. Discussed case with .Dr. Lake Bells . Pt will be called for time and date. Most likely will need EBUS .  Check ACE and CBC w/ diff

## 2015-11-28 NOTE — Patient Instructions (Addendum)
We are referring you for a biopsy for enlarged lympth nodes.  Continue on cough regimen .  Follow up Dr. Lake Bells in 6 weeks and As needed

## 2015-11-28 NOTE — Assessment & Plan Note (Signed)
Chronic cough x 3-4 months ? UAC w/ GERD /AR along with abnormal CT chest with mediastinal /hilar adnenopathy and lung nodules.   Plan  Will set up for bx  Cont on cough control .

## 2015-11-28 NOTE — Progress Notes (Signed)
Subjective:    Patient ID: Robert Morrison, male    DOB: 1955/11/15, 60 y.o.   MRN: [id number]  HPI 60 year old male never smoker seen for pulmonary consult 11/12/2015 for cough for 3 months  TEST  PFT 10/2015 NML HRCT CHEST  11/20/15>Moderate mediastinal and bilateral hilar lymphadenopathy. Several scattered solid pulmonary nodules in both lungs, the largest measuring 10 mm in the subpleural medial left upper lobe, although most of which are located in the peribronchial central lungs and measure up to 9 mm in the right lower lobe. Leading diagnostic considerations are sarcoidosis or lymphoma. Tissue sampling and/or short-term follow-up chest CT with IV contrast is advised. No evidence of interstitial lung disease.   11/28/2015 Follow up : Cough /CT chest  Patient presents for a two-week follow-up. Patient was seen for a pulmonary consult on 11/12/2015 for a cough for 3 months. She was treated for an upper airway cough with treatment aimed at trigger control with reflux and allergic rhinitis.PFT was nml w/ no airflow obstruction and nml DLCO .  He was set up for a CT chest that showed moderate mediastinal and bilateral hilar lymphadenopathy, scattered pulmonary nodules. No evidence of interstitial lung disease.  Cough is some better with delsym .  Patient denies any fever, chest pain, orthopnea, PND, leg swelling, or weight loss, hemoptysis .  No family sarcoid or lymphoma.  We discussed need to proceed with biopsy .    Past Medical History:  Diagnosis Date  . Hyperlipidemia   . Hypertension    No current outpatient prescriptions on file prior to visit.   No current facility-administered medications on file prior to visit.       Review of Systems Constitutional:   No  weight loss, night sweats,  Fevers, chills, fatigue, or  lassitude.  HEENT:   No headaches,  Difficulty swallowing,  Tooth/dental problems, or  Sore throat,                No sneezing, itching, ear ache, nasal  congestion, post nasal drip,   CV:  No chest pain,  Orthopnea, PND, swelling in lower extremities, anasarca, dizziness, palpitations, syncope.   GI  No heartburn, indigestion, abdominal pain, nausea, vomiting, diarrhea, change in bowel habits, loss of appetite, bloody stools.   Resp:   No chest wall deformity  Skin: no rash or lesions.  GU: no dysuria, change in color of urine, no urgency or frequency.  No flank pain, no hematuria   MS:  No joint pain or swelling.  No decreased range of motion.  No back pain.  Psych:  No change in mood or affect. No depression or anxiety.  No memory loss.         Objective:   Physical Exam  Vitals:   11/28/15 1411  BP: 130/88  Pulse: 73  SpO2: 96%  Weight: 171 lb 3.2 oz (77.7 kg)  Height: 5' 6.5" (1.689 m)   GEN: A/Ox3; pleasant , NAD, well nourished    HEENT:  [place]/AT,  EACs-clear, TMs-wnl, NOSE-clear, THROAT-clear, no lesions, no postnasal drip or exudate noted.   NECK:  Supple w/ fair ROM; no JVD; normal carotid impulses w/o bruits; no thyromegaly or nodules palpated; no lymphadenopathy.    RESP  Clear  P & A; w/o, wheezes/ rales/ or rhonchi. no accessory muscle use, no dullness to percussion  CARD:  RRR, no m/r/g  , no peripheral edema, pulses intact, no cyanosis or clubbing.  GI:   Soft & nt; nml  bowel sounds; no organomegaly or masses detected.   Musco: Warm bil, no deformities or joint swelling noted.   Neuro: alert, no focal deficits noted.    Skin: Warm, no lesions or rashes  Clif Serio NP-C  [facility]  11/28/2015         Assessment & Plan:

## 2015-11-30 NOTE — Progress Notes (Signed)
Reviewed, agree 

## 2015-12-02 LAB — ANGIOTENSIN CONVERTING ENZYME: Angiotensin-Converting Enzyme: 37 U/L (ref 9–67)

## 2015-12-02 NOTE — Progress Notes (Signed)
Called spoke with pt. Reviewed results and recs. Pt voiced understanding and had no further questions.

## 2015-12-03 ENCOUNTER — Other Ambulatory Visit: Payer: Self-pay

## 2015-12-03 DIAGNOSIS — Z01812 Encounter for preprocedural laboratory examination: Secondary | ICD-10-CM

## 2015-12-04 ENCOUNTER — Other Ambulatory Visit (INDEPENDENT_AMBULATORY_CARE_PROVIDER_SITE_OTHER): Payer: 59

## 2015-12-04 DIAGNOSIS — Z01812 Encounter for preprocedural laboratory examination: Secondary | ICD-10-CM

## 2015-12-04 LAB — CBC WITH DIFFERENTIAL/PLATELET
BASOS ABS: 0 10*3/uL (ref 0.0–0.1)
BASOS PCT: 0.6 % (ref 0.0–3.0)
EOS ABS: 0.2 10*3/uL (ref 0.0–0.7)
Eosinophils Relative: 2.9 % (ref 0.0–5.0)
HEMATOCRIT: 44.7 % (ref 39.0–52.0)
HEMOGLOBIN: 14.9 g/dL (ref 13.0–17.0)
LYMPHS PCT: 11.6 % — AB (ref 12.0–46.0)
Lymphs Abs: 0.9 10*3/uL (ref 0.7–4.0)
MCHC: 33.3 g/dL (ref 30.0–36.0)
MCV: 80.2 fl (ref 78.0–100.0)
MONOS PCT: 7.7 % (ref 3.0–12.0)
Monocytes Absolute: 0.6 10*3/uL (ref 0.1–1.0)
NEUTROS ABS: 5.7 10*3/uL (ref 1.4–7.7)
Neutrophils Relative %: 77.2 % — ABNORMAL HIGH (ref 43.0–77.0)
PLATELETS: 183 10*3/uL (ref 150.0–400.0)
RBC: 5.57 Mil/uL (ref 4.22–5.81)
RDW: 14.7 % (ref 11.5–15.5)
WBC: 7.4 10*3/uL (ref 4.0–10.5)

## 2015-12-05 ENCOUNTER — Encounter (HOSPITAL_COMMUNITY): Payer: Self-pay | Admitting: *Deleted

## 2015-12-08 ENCOUNTER — Ambulatory Visit (HOSPITAL_COMMUNITY)
Admission: RE | Admit: 2015-12-08 | Discharge: 2015-12-08 | Disposition: A | Discharge status: Home / Self Care | Payer: 59 | Source: Ambulatory Visit | Attending: Pulmonary Disease | Admitting: Pulmonary Disease

## 2015-12-08 ENCOUNTER — Encounter (HOSPITAL_COMMUNITY): Payer: Self-pay

## 2015-12-08 ENCOUNTER — Encounter (HOSPITAL_COMMUNITY)
Admission: RE | Disposition: A | Discharge status: Home / Self Care | Payer: Self-pay | Source: Ambulatory Visit | Attending: Pulmonary Disease

## 2015-12-08 ENCOUNTER — Encounter (HOSPITAL_COMMUNITY): Payer: 59

## 2015-12-08 ENCOUNTER — Ambulatory Visit (HOSPITAL_COMMUNITY): Payer: 59 | Admitting: Anesthesiology

## 2015-12-08 DIAGNOSIS — R05 Cough: Secondary | ICD-10-CM | POA: Diagnosis not present

## 2015-12-08 DIAGNOSIS — R590 Localized enlarged lymph nodes: Secondary | ICD-10-CM | POA: Insufficient documentation

## 2015-12-08 DIAGNOSIS — E785 Hyperlipidemia, unspecified: Secondary | ICD-10-CM | POA: Diagnosis not present

## 2015-12-08 DIAGNOSIS — Z885 Allergy status to narcotic agent status: Secondary | ICD-10-CM | POA: Diagnosis not present

## 2015-12-08 DIAGNOSIS — I10 Essential (primary) hypertension: Secondary | ICD-10-CM | POA: Diagnosis not present

## 2015-12-08 HISTORY — DX: Other specified postprocedural states: R11.2

## 2015-12-08 HISTORY — DX: Other specified postprocedural states: Z98.890

## 2015-12-08 HISTORY — PX: ENDOBRONCHIAL ULTRASOUND: SHX5096

## 2015-12-08 SURGERY — ENDOBRONCHIAL ULTRASOUND (EBUS)
Anesthesia: General | Laterality: Bilateral

## 2015-12-08 MED ORDER — SUCCINYLCHOLINE CHLORIDE 20 MG/ML IJ SOLN
INTRAMUSCULAR | Status: AC
  Filled 2015-12-08: qty 1

## 2015-12-08 MED ORDER — EPHEDRINE SULFATE 50 MG/ML IJ SOLN
INTRAMUSCULAR | Status: DC | PRN
  Administered 2015-12-08 (×2): 15 mg via INTRAVENOUS

## 2015-12-08 MED ORDER — ROCURONIUM BROMIDE 50 MG/5ML IV SOSY
PREFILLED_SYRINGE | INTRAVENOUS | Status: AC
  Filled 2015-12-08: qty 5

## 2015-12-08 MED ORDER — FENTANYL CITRATE (PF) 100 MCG/2ML IJ SOLN
INTRAMUSCULAR | Status: AC
  Filled 2015-12-08: qty 2

## 2015-12-08 MED ORDER — ONDANSETRON HCL 4 MG/2ML IJ SOLN
INTRAMUSCULAR | Status: AC
  Filled 2015-12-08: qty 2

## 2015-12-08 MED ORDER — DEXAMETHASONE SODIUM PHOSPHATE 10 MG/ML IJ SOLN
INTRAMUSCULAR | Status: DC | PRN
  Administered 2015-12-08: 10 mg via INTRAVENOUS

## 2015-12-08 MED ORDER — FENTANYL CITRATE (PF) 250 MCG/5ML IJ SOLN
INTRAMUSCULAR | Status: DC | PRN
  Administered 2015-12-08 (×2): 50 ug via INTRAVENOUS

## 2015-12-08 MED ORDER — DEXAMETHASONE SODIUM PHOSPHATE 10 MG/ML IJ SOLN
INTRAMUSCULAR | Status: AC
  Filled 2015-12-08: qty 1

## 2015-12-08 MED ORDER — LIDOCAINE 2% (20 MG/ML) 5 ML SYRINGE
INTRAMUSCULAR | Status: AC
  Filled 2015-12-08: qty 5

## 2015-12-08 MED ORDER — PROPOFOL 10 MG/ML IV BOLUS
INTRAVENOUS | Status: DC | PRN
  Administered 2015-12-08: 200 mg via INTRAVENOUS

## 2015-12-08 MED ORDER — PROPOFOL 10 MG/ML IV BOLUS
INTRAVENOUS | Status: AC
  Filled 2015-12-08: qty 20

## 2015-12-08 MED ORDER — PROMETHAZINE HCL 25 MG/ML IJ SOLN: 6.2500 mg | INTRAMUSCULAR | Status: DC | PRN

## 2015-12-08 MED ORDER — LACTATED RINGERS IV SOLN
INTRAVENOUS | Status: DC
  Administered 2015-12-08: 1000 mL via INTRAVENOUS

## 2015-12-08 MED ORDER — LIDOCAINE 2% (20 MG/ML) 5 ML SYRINGE
INTRAMUSCULAR | Status: DC | PRN
  Administered 2015-12-08: 100 mg via INTRAVENOUS

## 2015-12-08 MED ORDER — MEPERIDINE HCL 100 MG/ML IJ SOLN: 6.2500 mg | INTRAMUSCULAR | Status: DC | PRN

## 2015-12-08 MED ORDER — ONDANSETRON HCL 4 MG/2ML IJ SOLN
INTRAMUSCULAR | Status: DC | PRN
  Administered 2015-12-08: 4 mg via INTRAVENOUS

## 2015-12-08 MED ORDER — SUCCINYLCHOLINE CHLORIDE 200 MG/10ML IV SOSY
PREFILLED_SYRINGE | INTRAVENOUS | Status: DC | PRN
  Administered 2015-12-08: 100 mg via INTRAVENOUS

## 2015-12-08 MED ORDER — EPHEDRINE 5 MG/ML INJ
INTRAVENOUS | Status: AC
  Filled 2015-12-08: qty 10

## 2015-12-08 NOTE — Transfer of Care (Signed)
Immediate Anesthesia Transfer of Care Note  Patient: Robert Morrison  Procedure(s) Performed: Procedure(s): ENDOBRONCHIAL ULTRASOUND (Bilateral)  Patient Location: PACU and Endoscopy Unit  Anesthesia Type:General  Level of Consciousness: awake, alert , oriented and patient cooperative  Airway & Oxygen Therapy: Patient Spontanous Breathing and Patient connected to face mask oxygen  Post-op Assessment: Report given to RN and Post -op Vital signs reviewed and stable  Post vital signs: Reviewed and stable  Last Vitals:  Vitals:   12/08/15 0810  BP: (!) 184/105  Pulse: 77  Resp: 16  Temp: 36.7 C    Last Pain:  Vitals:   12/08/15 0810  TempSrc: Oral         Complications: No apparent anesthesia complications

## 2015-12-08 NOTE — Anesthesia Preprocedure Evaluation (Signed)
Anesthesia Evaluation  Patient identified by MRN, date of birth, ID band Patient awake    Reviewed: Allergy & Precautions, NPO status , Patient's Chart, lab work & pertinent test results  History of Anesthesia Complications (+) PONV and history of anesthetic complications  Airway Mallampati: II  TM Distance: >3 FB Neck ROM: Full    Dental no notable dental hx.    Pulmonary neg pulmonary ROS,    Pulmonary exam normal breath sounds clear to auscultation       Cardiovascular hypertension, negative cardio ROS Normal cardiovascular exam Rhythm:Regular Rate:Normal     Neuro/Psych negative neurological ROS  negative psych ROS   GI/Hepatic negative GI ROS, Neg liver ROS,   Endo/Other  negative endocrine ROS  Renal/GU negative Renal ROS     Musculoskeletal negative musculoskeletal ROS (+)   Abdominal   Peds  Hematology negative hematology ROS (+)   Anesthesia Other Findings   Reproductive/Obstetrics                            Anesthesia Physical Anesthesia Plan  ASA: II  Anesthesia Plan: General   Post-op Pain Management:    Induction: Intravenous  Airway Management Planned: Oral ETT  Additional Equipment:   Intra-op Plan:   Post-operative Plan: Extubation in OR  Informed Consent: I have reviewed the patients History and Physical, chart, labs and discussed the procedure including the risks, benefits and alternatives for the proposed anesthesia with the patient or authorized representative who has indicated his/her understanding and acceptance.   Dental advisory given  Plan Discussed with: CRNA  Anesthesia Plan Comments:         Anesthesia Quick Evaluation

## 2015-12-08 NOTE — H&P (Signed)
Robert Morrison is an 60 y.o. male.   Chief Complaint: mediastinal lymphadenopathy HPI: 60 y/o male referred to my office for cough, found to have mediastinal lymphadenopathy on CT chest.  Plan today for endobronchial ultrasound and needle aspiration.  Feel OK today, cough better.  Past Medical History:  Diagnosis Date  . Hyperlipidemia   . Hypertension    patient states has white coat syndrome  . PONV (postoperative nausea and vomiting)     Past Surgical History:  Procedure Laterality Date  . COLONOSCOPY  12-05-2009  . ELBOW FRACTURE SURGERY Left 1998  . POLYPECTOMY    . ROTATOR CUFF REPAIR Left 2010    Family History  Problem Relation Age of Onset  . Colon polyps Sister   . Colon cancer Neg Hx    Social History:  reports that he has never smoked. He has never used smokeless tobacco. He reports that he drinks alcohol. He reports that he does not use drugs.  Allergies:  Allergies  Allergen Reactions  . Hydrocodone-Acetaminophen     REACTION: rash    No prescriptions prior to admission.    No results found for this or any previous visit (from the past 48 hour(s)). No results found.  ROS  Blood pressure (!) 184/105, pulse 77, temperature 98 F (36.7 C), temperature source Oral, resp. rate 16, height 5' 6.5" (1.689 m), weight 77.6 kg (171 lb), SpO2 98 %. Physical Exam   Gen: well appearing HENT: OP clear,  neck supple PULM: CTA B, normal percussion CV: RRR, no mgr, trace edema GI: BS+, soft, nontender Derm: no cyanosis or rash Psyche: normal mood and affect   Assessment/Plan Mediastinal lymphadenopathy: plan for endobronchial ultrasound guided needle aspiration under general anesthesia.  Risks, benefits explained to patient and wife.   Simonne Maffucci, MD 12/08/2015, 8:48 AM

## 2015-12-08 NOTE — Anesthesia Procedure Notes (Signed)
Procedure Name: Intubation Date/Time: 12/08/2015 9:34 AM Performed by: Dione Booze Pre-anesthesia Checklist: Patient identified, Emergency Drugs available, Suction available and Patient being monitored Patient Re-evaluated:Patient Re-evaluated prior to inductionOxygen Delivery Method: Circle system utilized Preoxygenation: Pre-oxygenation with 100% oxygen Intubation Type: IV induction Ventilation: Mask ventilation without difficulty Laryngoscope Size: Mac and 4 Grade View: Grade I Tube type: Oral Tube size: 8.5 mm Number of attempts: 1 Airway Equipment and Method: Stylet Placement Confirmation: ETT inserted through vocal cords under direct vision,  positive ETCO2 and breath sounds checked- equal and bilateral Secured at: 23 cm Tube secured with: Tape Dental Injury: Teeth and Oropharynx as per pre-operative assessment

## 2015-12-08 NOTE — Op Note (Signed)
Video Bronchoscopy with Endobronchial Ultrasound Procedure Note  Date of Operation: 12/08/2015  Pre-op Diagnosis: Mediastinal lymphadenopathy  Post-op Diagnosis: Same  Surgeon: Roselie Awkward MD  Assistants: None  Anesthesia: General endotracheal anesthesia  Operation: Flexible video fiberoptic bronchoscopy with endobronchial ultrasound and biopsies.  Estimated Blood Loss: Minimal  Complications: None immediate  Indications and History: Robert Morrison is a 60 y.o. male with mediastinal lymphadenopathy discovered when he was worked up for cough.  He is a non-smoker.  The risks, benefits, complications, treatment options and expected outcomes were discussed with the patient.  The possibilities of pneumothorax, pneumonia, reaction to medication, pulmonary aspiration, perforation of a viscus, bleeding, failure to diagnose a condition and creating a complication requiring transfusion or operation were discussed with the patient who freely signed the consent.    Description of Procedure: The patient was examined in the preoperative area and history and data from the preprocedure consultation were reviewed. It was deemed appropriate to proceed.  The patient was taken to the [facility], identified as [name] and the procedure verified as Flexible Video Fiberoptic Bronchoscopy.  A Time Out was held and the above information confirmed. After being taken to the operating room general anesthesia was initiated and the patient  was orally intubated. The video fiberoptic bronchoscope was introduced via the endotracheal tube and a general inspection was performed which showed mild cobble stone changes in the right middle lobe, but no mass or lesion. The standard scope was then withdrawn and the endobronchial ultrasound was used to identify and characterize the peritracheal, hilar and bronchial lymph nodes. Inspection showed nodal enlargement at station 10L and 7, but the station 4 nodes  and right sided notes were not increased in size. Using real-time ultrasound guidance Wang needle biopsies were take from Station 7 and 10 L nodes and were sent for cytology. The patient tolerated the procedure well without apparent complications. There was no significant blood loss. The bronchoscope was withdrawn. Anesthesia was reversed and the patient was taken to the PACU for recovery.   Samples: 1. Wang needle biopsies from 7 node 2. Wang needle biopsies from 10L node 3. BAL RML> sent for bacterial, fungal, AFB cultures and cytology 4. Endobronchial biopsies from the right lower lobe orifice.    Plans:  The patient will be discharged from the PACU to home when recovered from anesthesia. We will review the cytology, pathology and microbiology results with the patient when they become available. Outpatient followup will be with [facility].    Roselie Awkward, MD [facility] Pager: [phone] Cell: [phone] After 3pm or if no response, call [phone]

## 2015-12-08 NOTE — Progress Notes (Signed)
Video bronchoscopy performed following EBUS procedure Intervention bronchial washings Intervention bronchial biopsies Pt tolerated well  Jilberto Vanderwall David  RRT 

## 2015-12-09 LAB — ACID FAST SMEAR (AFB, MYCOBACTERIA): Acid Fast Smear: NEGATIVE

## 2015-12-09 NOTE — Anesthesia Postprocedure Evaluation (Signed)
Anesthesia Post Note  Patient: Robert Morrison  Procedure(s) Performed: Procedure(s) (LRB): ENDOBRONCHIAL ULTRASOUND (Bilateral)  Patient location during evaluation: PACU Anesthesia Type: General Level of consciousness: sedated and patient cooperative Pain management: pain level controlled Vital Signs Assessment: post-procedure vital signs reviewed and stable Respiratory status: spontaneous breathing Cardiovascular status: stable Anesthetic complications: no     Last Vitals:  Vitals:   12/08/15 1140 12/08/15 1150  BP: 138/74 132/76  Pulse: 82 88  Resp: 20 18  Temp:      Last Pain:  Vitals:   12/08/15 1047  TempSrc: Oral   Pain Goal:                 Nolon Nations

## 2015-12-10 ENCOUNTER — Telehealth: Payer: Self-pay | Admitting: Pulmonary Disease

## 2015-12-10 ENCOUNTER — Encounter (HOSPITAL_COMMUNITY): Payer: Self-pay | Admitting: Pulmonary Disease

## 2015-12-10 DIAGNOSIS — R0781 Pleurodynia: Secondary | ICD-10-CM

## 2015-12-10 DIAGNOSIS — R05 Cough: Secondary | ICD-10-CM

## 2015-12-10 DIAGNOSIS — R059 Cough, unspecified: Secondary | ICD-10-CM

## 2015-12-10 LAB — CULTURE, BAL-QUANTITATIVE W GRAM STAIN: Culture: NO GROWTH

## 2015-12-10 LAB — CULTURE, BAL-QUANTITATIVE: SPECIAL REQUESTS: NORMAL

## 2015-12-10 NOTE — Telephone Encounter (Signed)
Not normal.  He needs a CXR and OV, go to ER if worsens

## 2015-12-10 NOTE — Telephone Encounter (Signed)
Called and spoke with pt and he is aware of appt tomorrow with TP.  He will come in prior to appt for cxr.  Order has been placed.

## 2015-12-10 NOTE — Telephone Encounter (Signed)
Spoke with pt. States that he is having side effects from the bronch. Reports rib pain and wheezing when he lays down. Rib pain is irritated by coughing, clearing his throat or sneezing. He wants to know if these are common side effects after having a bronch.  BQ - please advise. Thanks.

## 2015-12-10 NOTE — Telephone Encounter (Signed)
No, this is not normal nor expected. He needs an OV and CXR

## 2015-12-11 ENCOUNTER — Ambulatory Visit (INDEPENDENT_AMBULATORY_CARE_PROVIDER_SITE_OTHER): Payer: 59 | Admitting: Adult Health

## 2015-12-11 ENCOUNTER — Encounter: Payer: Self-pay | Admitting: Adult Health

## 2015-12-11 ENCOUNTER — Ambulatory Visit (INDEPENDENT_AMBULATORY_CARE_PROVIDER_SITE_OTHER)
Admission: RE | Admit: 2015-12-11 | Discharge: 2015-12-11 | Disposition: A | Discharge status: Home / Self Care | Payer: 59 | Source: Ambulatory Visit | Attending: Pulmonary Disease | Admitting: Pulmonary Disease

## 2015-12-11 DIAGNOSIS — R059 Cough, unspecified: Secondary | ICD-10-CM

## 2015-12-11 DIAGNOSIS — R0781 Pleurodynia: Secondary | ICD-10-CM

## 2015-12-11 DIAGNOSIS — R05 Cough: Secondary | ICD-10-CM | POA: Diagnosis not present

## 2015-12-11 DIAGNOSIS — R590 Localized enlarged lymph nodes: Secondary | ICD-10-CM | POA: Diagnosis not present

## 2015-12-11 NOTE — Patient Instructions (Addendum)
Restart Pepcid 20mg  Twice daily  .  Use Delsym 2 tsp Twice daily  As needed  Cough . Follow up with Dr. Lake Bells next month and As needed   Please contact office for sooner follow up if symptoms do not improve or worsen or seek emergency care

## 2015-12-11 NOTE — Assessment & Plan Note (Signed)
Mediastinal/Hilar Lymphadenopathy s/p EBUS -Path/cytology is non diagnostic. Cx are neg to date  Will await final results. Will discuss with Dr. Lake Bells as he may need referral for mediastinoscopy vs repeat CT chest in 3 months.

## 2015-12-11 NOTE — Assessment & Plan Note (Signed)
UACS w/ adenopathy - improved on GERD tx .  Plan Patient Instructions  Restart Pepcid 20mg  Twice daily  .  Use Delsym 2 tsp Twice daily  As needed  Cough . Follow up with Dr. Lake Bells next month and As needed   Please contact office for sooner follow up if symptoms do not improve or worsen or seek emergency care

## 2015-12-11 NOTE — Progress Notes (Signed)
Subjective:    Patient ID: MATTHEW DHAWAN, male    DOB: [birth year], 60 y.o.   MRN: [id number]  HPI 60 year old male never smoker seen for pulmonary consult 11/12/2015 for cough for 3 months  TEST  PFT 10/2015 NML HRCT CHEST  11/20/15>Moderate mediastinal and bilateral hilar lymphadenopathy. Several scattered solid pulmonary nodules in both lungs, the largest measuring 10 mm in the subpleural medial left upper lobe, although most of which are located in the peribronchial central lungs and measure up to 9 mm in the right lower lobe. Leading diagnostic considerations are sarcoidosis or lymphoma. Tissue sampling and/or short-term follow-up chest CT with IV contrast is advised. No evidence of interstitial lung disease.   12/11/2015 Acute OV  : Cough Lonni Fix Lymphadenopathy Patient was seen for a pulmonary consult on 11/12/2015 for a cough for 3 months. He was treated for an upper airway cough with treatment aimed at trigger control with reflux and allergic rhinitis.PFT was nml w/ no airflow obstruction and nml DLCO .  He was set up for a CT chest that showed moderate mediastinal and bilateral hilar lymphadenopathy, scattered pulmonary nodules. No evidence of interstitial lung disease.  He was set up for a EBUS on 12/08/15. Endobronchial bx showed benign bronchial mucosa with focal squamous metaplasia, no granulomas or malignancy noted. Cytology was neg. Cx is neg to date  Pt says last 2 days he has had pain with cough and sneezing. CXR today shows no acute process.  Today he says pain is less. His dry cough has come back that he had before. Previously treated with delsym and pepcid but he stopped this . He denies fever, chest pain, orthopnea or edema.     Past Medical History:  Diagnosis Date  . Hyperlipidemia   . Hypertension    patient states has white coat syndrome  . PONV (postoperative nausea and vomiting)    No current outpatient prescriptions on file prior to visit.   No  current facility-administered medications on file prior to visit.       Review of Systems Constitutional:   No  weight loss, night sweats,  Fevers, chills, fatigue, or  lassitude.  HEENT:   No headaches,  Difficulty swallowing,  Tooth/dental problems, or  Sore throat,                No sneezing, itching, ear ache, nasal congestion, post nasal drip,   CV:  No chest pain,  Orthopnea, PND, swelling in lower extremities, anasarca, dizziness, palpitations, syncope.   GI  No heartburn, indigestion, abdominal pain, nausea, vomiting, diarrhea, change in bowel habits, loss of appetite, bloody stools.   Resp:   No chest wall deformity  Skin: no rash or lesions.  GU: no dysuria, change in color of urine, no urgency or frequency.  No flank pain, no hematuria   MS:  No joint pain or swelling.  No decreased range of motion.  No back pain.  Psych:  No change in mood or affect. No depression or anxiety.  No memory loss.         Objective:   Physical Exam  Vitals:   12/11/15 1624  Pulse: 75  SpO2: 97%  Weight: 175 lb (79.4 kg)  Height: 5' 6.5" (1.689 m)   GEN: A/Ox3; pleasant , NAD, well nourished    HEENT:  [place]/AT,  EACs-clear, TMs-wnl, NOSE-clear, THROAT-clear, no lesions, no postnasal drip or exudate noted.   NECK:  Supple w/ fair ROM; no JVD; normal carotid impulses w/o  bruits; no thyromegaly or nodules palpated; no lymphadenopathy.    RESP  Clear  P & A; w/o, wheezes/ rales/ or rhonchi. no accessory muscle use, no dullness to percussion  CARD:  RRR, no m/r/g  , no peripheral edema, pulses intact, no cyanosis or clubbing.  GI:   Soft & nt; nml bowel sounds; no organomegaly or masses detected.   Musco: Warm bil, no deformities or joint swelling noted.   Neuro: alert, no focal deficits noted.    Skin: Warm, no lesions or rashes  Addysyn Fern NP-C  [facility]  12/11/2015         Assessment & Plan:

## 2015-12-15 NOTE — Progress Notes (Signed)
Reviewed, he and I will discuss his appetite for a mediastinoscopy.  Given the resolution of symptoms and normal findings on needle aspiration of the lymph nodes we may be able to hold off on a mediastinoscopy and just follow with repeat CT scans.

## 2016-01-06 LAB — FUNGAL ORGANISM REFLEX

## 2016-01-06 LAB — FUNGUS CULTURE RESULT

## 2016-01-06 LAB — FUNGUS CULTURE WITH STAIN

## 2016-01-13 ENCOUNTER — Other Ambulatory Visit (INDEPENDENT_AMBULATORY_CARE_PROVIDER_SITE_OTHER): Payer: 59

## 2016-01-13 ENCOUNTER — Ambulatory Visit (INDEPENDENT_AMBULATORY_CARE_PROVIDER_SITE_OTHER)
Admission: RE | Admit: 2016-01-13 | Discharge: 2016-01-13 | Disposition: A | Discharge status: Home / Self Care | Payer: 59 | Source: Ambulatory Visit | Attending: Pulmonary Disease | Admitting: Pulmonary Disease

## 2016-01-13 ENCOUNTER — Telehealth: Payer: Self-pay | Admitting: Pulmonary Disease

## 2016-01-13 ENCOUNTER — Encounter: Payer: Self-pay | Admitting: Pulmonary Disease

## 2016-01-13 ENCOUNTER — Ambulatory Visit (INDEPENDENT_AMBULATORY_CARE_PROVIDER_SITE_OTHER): Payer: 59 | Admitting: Pulmonary Disease

## 2016-01-13 VITALS — BP 150/84 | HR 109 | Ht 66.5 in | Wt 170.0 lb

## 2016-01-13 DIAGNOSIS — J0410 Acute tracheitis without obstruction: Secondary | ICD-10-CM

## 2016-01-13 DIAGNOSIS — R05 Cough: Secondary | ICD-10-CM

## 2016-01-13 DIAGNOSIS — R590 Localized enlarged lymph nodes: Secondary | ICD-10-CM

## 2016-01-13 DIAGNOSIS — R059 Cough, unspecified: Secondary | ICD-10-CM

## 2016-01-13 LAB — POCT INFLUENZA A/B
INFLUENZA A, POC: NEGATIVE
Influenza B, POC: NEGATIVE

## 2016-01-13 MED ORDER — ALBUTEROL SULFATE HFA 108 (90 BASE) MCG/ACT IN AERS: 2.0000 | INHALATION_SPRAY | Freq: Four times a day (QID) | RESPIRATORY_TRACT | 6 refills | Status: DC | PRN

## 2016-01-13 MED ORDER — PROMETHAZINE-CODEINE 6.25-10 MG/5ML PO SYRP: 5.0000 mL | ORAL_SOLUTION | Freq: Four times a day (QID) | ORAL | Status: DC | PRN

## 2016-01-13 MED ORDER — PROMETHAZINE-CODEINE 6.25-10 MG/5ML PO SYRP: 5.0000 mL | ORAL_SOLUTION | Freq: Four times a day (QID) | ORAL | 0 refills | Status: DC | PRN

## 2016-01-13 NOTE — Addendum Note (Signed)
Addended by: Collier Salina on: 01/13/2016 01:24 PM   Modules accepted: Orders

## 2016-01-13 NOTE — Telephone Encounter (Signed)
Per BQ- Flu swab negative.  -----------------------------------------------  LMTCB for pt to inform. Will route to [place] to follow.

## 2016-01-13 NOTE — Telephone Encounter (Signed)
Spoke with pt. He is aware of results. Nothing further was needed. 

## 2016-01-13 NOTE — Patient Instructions (Addendum)
For the headache and viral symptoms you are experiencing: We will check an influenza panel We will check a chest x-ray to make sure you don't have pneumonia Take ibuprofen as needed for headache Drink plenty of fluids If the headache worsens go to the ER, advised at length today We will call you with the results of today's blood work (complete blood count) and chest x-ray  Cough: Resume gastroesophageal reflux treatment with over-the-counter pantoprazole Use the cough syrup with codeine as needed Use albuterol as needed for wheezing  For the mediastinal lymphadenopathy: We will refer you to thoracic surgery for further evaluation and a mediastinoscopy.  We will see you back in 6 weeks or sooner if needed

## 2016-01-13 NOTE — Progress Notes (Signed)
Subjective:    Patient ID: Robert Morrison, male    DOB: 1955/05/11, 60 y.o.   MRN: [id number]  Synopsis: Self-referral in 2017 for cough. A CT scan of the chest had been performed prior to that visit showing mediastinal lymphadenopathy. Underwent endobronchial ultrasound-guided fine-needle aspiration and BAL and endobronchial biopsy in October 2017 all of which was negative. Appears to have acid reflux. Cough improved with antacid treatment.  HPI Chief Complaint  Patient presents with  . Follow-up    Pt states his SOB has worsened since last OV, and increase in prod cough with yellow mucus and constant headache. Pt also c/o decrease in appetite and insomnia.    Era developed a headache last Friday.  He has been feeling fever and chills for the last few days as well.  His head is much worse than normal.  The cough is worse (see below) but not acutely in the last few days.  Last night he had a lot of chest congestion.  Tylenol doesn't help with the headache.  No one at work with similar symptoms.  No diarrhea in the last few days but his appetite has been less.  He has not been eating much.  His cough has worsened in the last few weeks.  He has some wheezing with every breath.  He also has dyspnea. This all worsened after the bronchoscopy.  His wife notes that he had a tick back in May.     Past Medical History:  Diagnosis Date  . Hyperlipidemia   . Hypertension    patient states has white coat syndrome  . PONV (postoperative nausea and vomiting)       Review of Systems  Constitutional: Positive for chills and fatigue. Negative for fever.  HENT: Positive for nosebleeds, postnasal drip and rhinorrhea.   Respiratory: Positive for cough, shortness of breath and wheezing.   Cardiovascular: Negative for chest pain, palpitations and leg swelling.       Objective:   Physical Exam Vitals:   01/13/16 1242  BP: (!) 150/84  Pulse: (!) 109  SpO2: 98%  Weight: 170 lb (77.1 kg)    Height: 5' 6.5" (1.689 m)   RA  Gen: acutely appearing HENT: OP clear, TM's clear, neck supple PULM: wheezing bilaterally, normal percussion CV: RRR, no mgr, trace edema GI: BS+, soft, nontender Derm: no cyanosis or rash Psyche: normal mood and affect   CBC    Component Value Date/Time   WBC 7.4 12/04/2015 0857   RBC 5.57 12/04/2015 0857   HGB 14.9 12/04/2015 0857   HCT 44.7 12/04/2015 0857   PLT 183.0 12/04/2015 0857   MCV 80.2 12/04/2015 0857   MCHC 33.3 12/04/2015 0857   RDW 14.7 12/04/2015 0857   LYMPHSABS 0.9 12/04/2015 0857   MONOABS 0.6 12/04/2015 0857   EOSABS 0.2 12/04/2015 0857   BASOSABS 0.0 12/04/2015 0857   Records from his visit with our nurse practitioner reviewed where she arrange for repeat CT scan     Assessment & Plan:   Impression: Acute viral respiratory infection Headache, likely related to viral prodrome Cough Acid reflux Mediastinal lymphadenopathy of undetermined etiology  Discussion: From an acute perspective Robert Morrison appears to have an acute viral infection leading to headache, sinus congestion cough, fevers and chills. Given the amount of viral infections going around right now we will check him for influenza today and then check a chest x-ray to ensure there is no evidence of pneumonia.  However, given the nonspecific systemic  complaints he has had lately including weight loss, recurrence of cough and wheezing with his mediastinal lymphadenopathy, I believe we need to pursue a mediastinoscopy to ensure there is no evidence of lymphoma or some other inflammatory process.  Finally, I think the recurrence of his cough is due to untreated acid reflux.  PLAN: For the headache and viral symptoms you are experiencing: We will check an influenza panel We will check a chest x-ray to make sure you don't have pneumonia Take ibuprofen as needed for headache Drink plenty of fluids If the headache worsens then go to the ER, advised a length  today We will call you with the results of today's blood work (complete blood count) and chest x-ray  Cough: Resume gastroesophageal reflux treatment with over-the-counter pantoprazole Use the cough syrup with codeine as needed Use albuterol as needed for wheezing  For the mediastinal lymphadenopathy: We will refer you to thoracic surgery for further evaluation and a mediastinoscopy.  We will see you back in 6 weeks or sooner if needed    Current Outpatient Prescriptions:  .  famotidine (PEPCID) 20 MG tablet, Take 20 mg by mouth 2 (two) times daily., Disp: , Rfl:

## 2016-01-13 NOTE — Addendum Note (Signed)
Addended by: Collier Salina on: 01/13/2016 01:45 PM   Modules accepted: Orders

## 2016-01-13 NOTE — Telephone Encounter (Signed)
[phone] pt calling back

## 2016-01-14 ENCOUNTER — Telehealth: Payer: Self-pay

## 2016-01-14 LAB — CBC WITH DIFFERENTIAL/PLATELET
BASOS PCT: 0.1 % (ref 0.0–3.0)
Basophils Absolute: 0 10*3/uL (ref 0.0–0.1)
EOS ABS: 0 10*3/uL (ref 0.0–0.7)
EOS PCT: 0.1 % (ref 0.0–5.0)
HEMATOCRIT: 45.2 % (ref 39.0–52.0)
HEMOGLOBIN: 15.3 g/dL (ref 13.0–17.0)
LYMPHS PCT: 5.3 % — AB (ref 12.0–46.0)
Lymphs Abs: 1 10*3/uL (ref 0.7–4.0)
MCHC: 33.8 g/dL (ref 30.0–36.0)
MCV: 80.4 fl (ref 78.0–100.0)
MONO ABS: 1 10*3/uL (ref 0.1–1.0)
Monocytes Relative: 5.6 % (ref 3.0–12.0)
Neutro Abs: 16.3 10*3/uL — ABNORMAL HIGH (ref 1.4–7.7)
Neutrophils Relative %: 88.9 % — ABNORMAL HIGH (ref 43.0–77.0)
Platelets: 212 10*3/uL (ref 150.0–400.0)
RBC: 5.62 Mil/uL (ref 4.22–5.81)
RDW: 14.4 % (ref 11.5–15.5)

## 2016-01-14 NOTE — Telephone Encounter (Signed)
Spoke with Santiago Glad from lab who states pt white blood count is 18.4  BQ please advise. Thanks.

## 2016-01-14 NOTE — Telephone Encounter (Signed)
Please call to see if he is feeling any better.  If no better or worse then see PCP or ER considering elevated WBC.  This is still most likely related to a viral infection.

## 2016-01-14 NOTE — Telephone Encounter (Signed)
Called and spoke with pt and he stated that he is feeling a little better today.  He is still taking it easy and resting. He is aware that if he starts to feel worse then either follow up in the ER or call his PCP for appt.  Pt has appt on 12/5 with Dr. Roxan Hockey. Will forward to BQ to make him aware.

## 2016-01-14 NOTE — Telephone Encounter (Signed)
thanks

## 2016-01-16 NOTE — Progress Notes (Signed)
Pt aware of results per BQ. Pt verbalized understanding and denied any further questions or concerns at this time.

## 2016-01-20 ENCOUNTER — Encounter: Payer: Self-pay | Admitting: Thoracic Surgery (Cardiothoracic Vascular Surgery)

## 2016-01-20 ENCOUNTER — Telehealth: Payer: Self-pay | Admitting: Pulmonary Disease

## 2016-01-20 ENCOUNTER — Institutional Professional Consult (permissible substitution) (INDEPENDENT_AMBULATORY_CARE_PROVIDER_SITE_OTHER): Payer: 59 | Admitting: Thoracic Surgery (Cardiothoracic Vascular Surgery)

## 2016-01-20 VITALS — BP 129/86 | HR 96 | Resp 16 | Ht 66.0 in | Wt 165.0 lb

## 2016-01-20 DIAGNOSIS — R590 Localized enlarged lymph nodes: Secondary | ICD-10-CM

## 2016-01-20 NOTE — Progress Notes (Signed)
PCP is Precious Reel, MD Referring Provider is Juanito Doom, MD  Chief Complaint  Patient presents with  . Adenopathy    mediastinal..endobronchial bx neg on 12/09/15, TC CHEST 11/20/15,PFT 10/21/15    HPI: Robert Morrison is a 60 year old man with a past medical history of hypertension and hyperlipidemia. Back in July he developed a cough. He was treated with antibiotics and steroids and his cough improved, but recurred shortly after stopping treatment. He had another round of steroids and again had the same response. Since then he's had a consistent severe cough, usually nonproductive. He had a bronchoscopy and endobronchial ultrasound by Dr. Lake Bells in October which was nondiagnostic. Last week he had 2-3 days where he had fevers, chills, and night sweats. He has not had any of those before or after that. He is hoarse. He has lost about 10 pounds over the past 6 months. He's hungry but food does not taste right to him.  Zubrod Score: At the time of surgery this patient's most appropriate activity status/level should be described as: []     0    Normal activity, no symptoms [x]     1    Restricted in physical strenuous activity but ambulatory, able to do out light work []     2    Ambulatory and capable of self care, unable to do work activities, up and about >50 % of waking hours                              []     3    Only limited self care, in bed greater than 50% of waking hours []     4    Completely disabled, no self care, confined to bed or chair []     5    Moribund    Past Medical History:  Diagnosis Date  . Hyperlipidemia   . Hypertension    patient states has white coat syndrome  . PONV (postoperative nausea and vomiting)     Past Surgical History:  Procedure Laterality Date  . COLONOSCOPY  12-05-2009  . ELBOW FRACTURE SURGERY Left 1998  . ENDOBRONCHIAL ULTRASOUND Bilateral 12/08/2015   Procedure: ENDOBRONCHIAL ULTRASOUND;  Surgeon: Juanito Doom, MD;  Location: [facility]  ENDOSCOPY;  Service: Cardiopulmonary;  Laterality: Bilateral;  . POLYPECTOMY    . ROTATOR CUFF REPAIR Left 2010    Family History  Problem Relation Age of Onset  . Colon polyps Sister   . Colon cancer Neg Hx     Social History Social History  Substance Use Topics  . Smoking status: Never Smoker  . Smokeless tobacco: Never Used  . Alcohol use 0.0 oz/week     Comment: rarely    Current Outpatient Prescriptions  Medication Sig Dispense Refill  . albuterol (PROVENTIL HFA;VENTOLIN HFA) 108 (90 Base) MCG/ACT inhaler Inhale 2 puffs into the lungs every 6 (six) hours as needed for wheezing or shortness of breath. 1 Inhaler 6  . azithromycin (ZITHROMAX) 500 MG tablet Take by mouth daily. FOR THREE DAYS    . loratadine (CLARITIN) 10 MG tablet Take 10 mg by mouth daily.    Marland Kitchen omeprazole (PRILOSEC) 20 MG capsule Take 20 mg by mouth daily.    . predniSONE (STERAPRED UNI-PAK 21 TAB) 10 MG (21) TBPK tablet Take 10 mg by mouth daily.    . promethazine-codeine (PHENERGAN WITH CODEINE) 6.25-10 MG/5ML syrup Take 5 mLs by mouth every 6 (six) hours as needed  for cough. 120 mL 0   Current Facility-Administered Medications  Medication Dose Route Frequency Provider Last Rate Last Dose  . promethazine-codeine (PHENERGAN with CODEINE) 6.25-10 MG/5ML syrup 5 mL  5 mL Oral Q6H PRN Juanito Doom, MD        Allergies  Allergen Reactions  . Hydrocodone-Acetaminophen     REACTION: rash    Review of Systems  Constitutional: Positive for chills, diaphoresis, fever and unexpected weight change. Negative for appetite change.  HENT: Positive for voice change. Negative for trouble swallowing.   Eyes: Negative for visual disturbance.  Respiratory: Positive for cough. Negative for shortness of breath and wheezing.   Cardiovascular: Negative for chest pain.  Gastrointestinal: Positive for abdominal pain (Upper abdominal strain from coughing). Negative for blood in stool.  Genitourinary: Negative for  difficulty urinating and dysuria.  Musculoskeletal: Negative for arthralgias and myalgias.  Neurological: Negative for seizures and syncope.  Hematological: Positive for adenopathy (CT). Does not bruise/bleed easily.  All other systems reviewed and are negative.   BP 129/86 (BP Location: Right Arm, Patient Position: Sitting, Cuff Size: Large)   Pulse 96   Resp 16   Ht 5\' 6"  (1.676 m)   Wt 165 lb (74.8 kg)   SpO2 94% Comment: ON RA  BMI 26.63 kg/m  Physical Exam  Constitutional: He is oriented to person, place, and time. He appears well-developed and well-nourished. No distress.  HENT:  Head: Normocephalic and atraumatic.  Mouth/Throat: No oropharyngeal exudate.  Eyes: Conjunctivae and EOM are normal. No scleral icterus.  Neck: Neck supple. No thyromegaly present.  Cardiovascular: Normal rate, regular rhythm, normal heart sounds and intact distal pulses.   No murmur heard. Pulmonary/Chest: Effort normal and breath sounds normal. No respiratory distress. He has no wheezes. He has no rales.  Abdominal: Soft. He exhibits no distension. There is no tenderness.  Musculoskeletal: He exhibits no edema.  Lymphadenopathy:    He has no cervical adenopathy.  Neurological: He is alert and oriented to person, place, and time. No cranial nerve deficit.  Skin: Skin is warm and dry.  Vitals reviewed.    Diagnostic Tests: CT CHEST WITHOUT CONTRAST  TECHNIQUE: Multidetector CT imaging of the chest was performed following the standard protocol without intravenous contrast. High resolution imaging of the lungs, as well as inspiratory and expiratory imaging, was performed.  COMPARISON:  None.  FINDINGS: Cardiovascular: Normal heart size. No significant pericardial fluid/thickening. Left anterior descending coronary atherosclerosis. Atherosclerotic nonaneurysmal thoracic aorta. Normal caliber pulmonary arteries.  Mediastinum/Nodes: Suggestion of a 1.0 cm hypodense right thyroid lobe  nodule. Unremarkable esophagus. No axillary adenopathy. Mild right paratracheal adenopathy measuring up to 1.7 cm (series 4/ image 43). AP window adenopathy measuring up to 1.2 cm (series 4/ image 53) enlarged 1.8 cm subcarinal node (series 4/ image 69). Enlarged 1.0 cm left internal mammary node (series 4/ image 33). Mild to moderate bilateral hilar adenopathy, poorly delineated on this noncontrast study.  Lungs/Pleura: No pneumothorax. No pleural effusion. There a few scattered central peribronchial solid nodules in both lungs, for example a 9 mm central right lower lobe solid nodule (series 5/image 90), a 7 mm central left upper lobe solid nodule (series 5/ image 62) and a 7 mm central left lower lobe solid nodule (series 5/ image 92). There is a 10 mm solid subpleural pulmonary nodule in the medial left upper lobe (series 5/image 68). There are a few additional tiny solid pulmonary nodules associated with the peripheral peribronchovascular structures, for example a 5 mm  posterior left upper lobe nodule (series 5/ image 63). No acute consolidative airspace disease or lung masses. No significant regions of subpleural reticulation, ground-glass attenuation, traction bronchiectasis, architectural distortion, parenchymal banding or frank honeycombing. No significant air trapping on the expiration sequence.  Upper abdomen: Unremarkable.  Musculoskeletal: No aggressive appearing focal osseous lesions. Mild thoracic spondylosis.  IMPRESSION: 1. Moderate mediastinal and bilateral hilar lymphadenopathy. Several scattered solid pulmonary nodules in both lungs, the largest measuring 10 mm in the subpleural medial left upper lobe, although most of which are located in the peribronchial central lungs and measure up to 9 mm in the right lower lobe. Leading diagnostic considerations are sarcoidosis or lymphoma. Tissue sampling and/or short-term follow-up chest CT with IV contrast is  advised. 2. No evidence of interstitial lung disease. 3. Possible 1.0 cm right thyroid lobe nodule, for which no further evaluation is required. This follows ACR consensus guidelines: Managing Incidental Thyroid Nodules Detected on Imaging: White Paper of the ACR Incidental Thyroid Findings Committee. J Am Coll Radiol 2015; 12:143-150. 4. Additional findings include aortic atherosclerosis and 1 vessel coronary atherosclerosis.   Electronically Signed   By: Ilona Sorrel M.D.   On: 11/20/2015 11:14 I personally reviewed the CT and concur with the findings noted above.  Impression: Robert Morrison is a 60 year old man with a 6 month history of a persistent cough and mediastinal and hilar adenopathy on CT. He also has several small pulmonary nodules bilaterally. The differential diagnosis primarily comes down to sarcoidosis versus lymphoma, although AFB or fungal infections are also a possibility.  He had a nondiagnostic bronchoscopy and endobronchial ultrasound, which is not surprising in this setting. I think the best option at this point would be to proceed with mediastinal endoscopy for biopsy to try to establish a definitive diagnosis.  I had a long discussion with Robert. and Mrs. Rosana Berger. I reviewed the films with them. I discussed the procedure mediastinal endoscopy with them. They understand this would be done on an outpatient basis but in the operating room under general anesthesia. They understand the incision to be made. I informed them of the indications, risks, benefits, and alternatives. They understand the risks include, but are not limited to death, MI, DVT, PE, bleeding, possible need for transfusion or thoracotomy, infection, stroke, recurrent nerve injury leading to permanent hoarseness, and pneumothorax. They also understand there is a possibility of other unforeseeable, complications. They do understand that this is a low risk procedure.  He expressed some reluctance initially but  then decided he would like to proceed on Thursday, 01/29/2016  Plan: Mediastinoscopy on Thursday, 01/29/2016.  Melrose Nakayama, MD [facility] [phone]

## 2016-01-20 NOTE — Telephone Encounter (Signed)
Dr. Sherrin Daisy  Previous phone message is below. We did attempt to call Dr. Virgina Jock but was unable to get through.    Name not recorded)  to Mearl Latin       01/20/16 3:01 PM  Dr. Virgina Jock [phone], discuss next option for patient about shortness of breath, feeling a lot worse.. ok to respond to this message as soon as can...ert

## 2016-01-21 ENCOUNTER — Other Ambulatory Visit: Payer: Self-pay | Admitting: *Deleted

## 2016-01-21 DIAGNOSIS — R590 Localized enlarged lymph nodes: Secondary | ICD-10-CM

## 2016-01-21 LAB — ACID FAST CULTURE WITH REFLEXED SENSITIVITIES (MYCOBACTERIA): Acid Fast Culture: NEGATIVE

## 2016-01-21 NOTE — Telephone Encounter (Signed)
I spoke with Dr. Virgina Jock this morning.

## 2016-01-22 ENCOUNTER — Telehealth: Payer: Self-pay | Admitting: Pulmonary Disease

## 2016-01-22 MED ORDER — PROMETHAZINE-CODEINE 6.25-10 MG/5ML PO SYRP: 5.0000 mL | ORAL_SOLUTION | Freq: Four times a day (QID) | ORAL | 0 refills | Status: DC | PRN

## 2016-01-22 NOTE — Telephone Encounter (Signed)
Pt calling again wanting refill on phen with cod syrup  His cough is not improving much and he is coughing until he has abd pain  He has one dose left  Please advise thanks

## 2016-01-22 NOTE — Telephone Encounter (Signed)
Pt  Returning call to check on request.Robert Morrison

## 2016-01-22 NOTE — Telephone Encounter (Signed)
Spoke with the pt and notified ok to refill  Rx called to pharm  Nothing further needed

## 2016-01-22 NOTE — Telephone Encounter (Signed)
Yes, OK to refill 

## 2016-01-22 NOTE — Telephone Encounter (Signed)
Called and spoke to pt. Pt is requesting a refill of Phenergan with Codeine. This was last filled while being seen in office on 11.28.17 by BQ. Pt states his cough is no better.  Last filled for #169ml and taking 29ml q6 hours prn.   Dr. Lake Bells please advise. Thanks.

## 2016-01-27 ENCOUNTER — Encounter (HOSPITAL_COMMUNITY)
Admission: RE | Admit: 2016-01-27 | Discharge: 2016-01-27 | Disposition: A | Discharge status: Home / Self Care | Payer: 59 | Source: Ambulatory Visit | Attending: Thoracic Surgery (Cardiothoracic Vascular Surgery) | Admitting: Thoracic Surgery (Cardiothoracic Vascular Surgery)

## 2016-01-27 ENCOUNTER — Ambulatory Visit (HOSPITAL_COMMUNITY)
Admission: RE | Admit: 2016-01-27 | Discharge: 2016-01-27 | Disposition: A | Discharge status: Home / Self Care | Payer: 59 | Source: Ambulatory Visit | Attending: Thoracic Surgery (Cardiothoracic Vascular Surgery) | Admitting: Thoracic Surgery (Cardiothoracic Vascular Surgery)

## 2016-01-27 ENCOUNTER — Encounter (HOSPITAL_COMMUNITY): Payer: Self-pay

## 2016-01-27 DIAGNOSIS — R590 Localized enlarged lymph nodes: Secondary | ICD-10-CM | POA: Insufficient documentation

## 2016-01-27 DIAGNOSIS — Z01818 Encounter for other preprocedural examination: Secondary | ICD-10-CM | POA: Diagnosis present

## 2016-01-27 HISTORY — DX: Personal history of colonic polyps: Z86.010

## 2016-01-27 HISTORY — DX: Pneumonia, unspecified organism: J18.9

## 2016-01-27 HISTORY — DX: Allergy, unspecified, initial encounter: T78.40XA

## 2016-01-27 HISTORY — DX: Gastro-esophageal reflux disease without esophagitis: K21.9

## 2016-01-27 HISTORY — DX: Personal history of other diseases of the respiratory system: Z87.09

## 2016-01-27 HISTORY — DX: Personal history of colon polyps, unspecified: Z86.0100

## 2016-01-27 LAB — CBC
HEMATOCRIT: 45 % (ref 39.0–52.0)
Hemoglobin: 14.5 g/dL (ref 13.0–17.0)
MCH: 26.4 pg (ref 26.0–34.0)
MCHC: 32.2 g/dL (ref 30.0–36.0)
MCV: 81.8 fL (ref 78.0–100.0)
Platelets: 423 10*3/uL — ABNORMAL HIGH (ref 150–400)
RBC: 5.5 MIL/uL (ref 4.22–5.81)
RDW: 13.9 % (ref 11.5–15.5)
WBC: 10.9 10*3/uL — ABNORMAL HIGH (ref 4.0–10.5)

## 2016-01-27 LAB — COMPREHENSIVE METABOLIC PANEL
ALT: 25 U/L (ref 17–63)
ANION GAP: 12 (ref 5–15)
AST: 23 U/L (ref 15–41)
Albumin: 3.5 g/dL (ref 3.5–5.0)
Alkaline Phosphatase: 68 U/L (ref 38–126)
BILIRUBIN TOTAL: 0.4 mg/dL (ref 0.3–1.2)
BUN: 19 mg/dL (ref 6–20)
CO2: 25 mmol/L (ref 22–32)
Calcium: 9.3 mg/dL (ref 8.9–10.3)
Chloride: 104 mmol/L (ref 101–111)
Creatinine, Ser: 1.26 mg/dL — ABNORMAL HIGH (ref 0.61–1.24)
GFR, EST NON AFRICAN AMERICAN: 59 mL/min — AB (ref >60)
Glucose, Bld: 103 mg/dL — ABNORMAL HIGH (ref 65–99)
POTASSIUM: 4.8 mmol/L (ref 3.5–5.1)
Sodium: 141 mmol/L (ref 135–145)
TOTAL PROTEIN: 7.7 g/dL (ref 6.5–8.1)

## 2016-01-27 LAB — PROTIME-INR
INR: 1.03
PROTHROMBIN TIME: 13.5 s (ref 11.4–15.2)

## 2016-01-27 LAB — TYPE AND SCREEN
ABO/RH(D): O NEG
ANTIBODY SCREEN: NEGATIVE

## 2016-01-27 LAB — SURGICAL PCR SCREEN
MRSA, PCR: NEGATIVE
Staphylococcus aureus: NEGATIVE

## 2016-01-27 LAB — APTT: aPTT: 32 seconds (ref 24–36)

## 2016-01-27 LAB — ABO/RH: ABO/RH(D): O NEG

## 2016-01-27 NOTE — Progress Notes (Addendum)
Cardiologist denies  Medical Md is Dr.John Virgina Jock  Echo denies  Stress test thinks he had one about 30 yrs ago  Heart cath denies

## 2016-01-27 NOTE — Pre-Procedure Instructions (Signed)
Robert Morrison  01/27/2016      [facility] - [place], [place] - [address] [place] [address] Phone: [phone] Fax: [phone]    Your procedure is scheduled on Thurs, Dec `14 @ 8:00 AM  Report to [facility] Admitting at 6:00 AM  Call this number if you have problems the morning of surgery:  [phone]   Remember:  Do not eat food or drink liquids after midnight.  Take these medicines the morning of surgery with A SIP OF WATER Albuterol<Bring Your Inhaler With You>, Claritin(Loratadine), and Omeprazole(Prilosec)             No Goody's,BC's,Aleve,Advil,Motrin,Ibuprofen,Fish Oil,or any Herbal Medications.    Do not wear jewelry.  Do not wear lotions, powders,colognes, or deoderant.             Men may shave face and neck.  Do not bring valuables to the hospital.  [facility] is not responsible for any belongings or valuables.  Contacts, dentures or bridgework may not be worn into surgery.  Leave your suitcase in the car.  After surgery it may be brought to your room.  For patients admitted to the hospital, discharge time will be determined by your treatment team.  Patients discharged the day of surgery will not be allowed to drive home.    [facility]  Before surgery, you can play an important role.  Because skin is not sterile, your skin needs to be as free of germs as possible.  You can reduce the number of germs on you skin by washing with CHG (chlorahexidine gluconate) soap before surgery.  CHG is an antiseptic cleaner which kills germs and bonds with the skin to continue killing germs even after washing.  Please DO NOT use if you have an allergy to CHG or antibacterial soaps.  If your skin becomes reddened/irritated stop using the CHG and inform your nurse when you arrive at Short Stay.  Do not shave (including legs and underarms) for at least 48 hours prior to the  first CHG shower.  You may shave your face.  Please follow these instructions carefully:   1.  Shower with CHG Soap the night before surgery and the                                morning of Surgery.  2.  If you choose to wash your hair, wash your hair first as usual with your       normal shampoo.  3.  After you shampoo, rinse your hair and body thoroughly to remove the                      Shampoo.  4.  Use CHG as you would any other liquid soap.  You can apply chg directly       to the skin and wash gently with scrungie or a clean washcloth.  5.  Apply the CHG Soap to your body ONLY FROM THE NECK DOWN.        Do not use on open wounds or open sores.  Avoid contact with your eyes,       ears, mouth and genitals (private parts).  Wash genitals (private parts)       with your normal soap.  6.  Wash thoroughly, paying special attention to  the area where your surgery        will be performed.  7.  Thoroughly rinse your body with warm water from the neck down.  8.  DO NOT shower/wash with your normal soap after using and rinsing off       the CHG Soap.  9.  Pat yourself dry with a clean towel.            10.  Wear clean pajamas.            11.  Place clean sheets on your bed the night of your first shower and do not        sleep with pets.  Day of Surgery  Do not apply any lotions/deoderants the morning of surgery.  Please wear clean clothes to the hospital/surgery center.    Please read over the following fact sheets that you were given. Pain Booklet, Coughing and Deep Breathing, MRSA Information and Surgical Site Infection Prevention

## 2016-01-29 ENCOUNTER — Encounter (HOSPITAL_COMMUNITY)
Admission: RE | Disposition: A | Discharge status: Home / Self Care | Payer: Self-pay | Source: Ambulatory Visit | Attending: Thoracic Surgery (Cardiothoracic Vascular Surgery)

## 2016-01-29 ENCOUNTER — Ambulatory Visit (HOSPITAL_COMMUNITY)
Admission: RE | Admit: 2016-01-29 | Discharge: 2016-01-29 | Disposition: A | Discharge status: Home / Self Care | Payer: 59 | Source: Ambulatory Visit | Attending: Thoracic Surgery (Cardiothoracic Vascular Surgery) | Admitting: Thoracic Surgery (Cardiothoracic Vascular Surgery)

## 2016-01-29 ENCOUNTER — Encounter (HOSPITAL_COMMUNITY): Payer: Self-pay | Admitting: Surgery

## 2016-01-29 ENCOUNTER — Ambulatory Visit (HOSPITAL_COMMUNITY): Payer: 59 | Admitting: Anesthesiology

## 2016-01-29 DIAGNOSIS — R918 Other nonspecific abnormal finding of lung field: Secondary | ICD-10-CM | POA: Insufficient documentation

## 2016-01-29 DIAGNOSIS — K219 Gastro-esophageal reflux disease without esophagitis: Secondary | ICD-10-CM | POA: Diagnosis not present

## 2016-01-29 DIAGNOSIS — I2510 Atherosclerotic heart disease of native coronary artery without angina pectoris: Secondary | ICD-10-CM | POA: Diagnosis not present

## 2016-01-29 DIAGNOSIS — I10 Essential (primary) hypertension: Secondary | ICD-10-CM | POA: Diagnosis not present

## 2016-01-29 DIAGNOSIS — I888 Other nonspecific lymphadenitis: Secondary | ICD-10-CM | POA: Insufficient documentation

## 2016-01-29 DIAGNOSIS — Z7952 Long term (current) use of systemic steroids: Secondary | ICD-10-CM | POA: Insufficient documentation

## 2016-01-29 DIAGNOSIS — I700 Atherosclerosis of aorta: Secondary | ICD-10-CM | POA: Diagnosis not present

## 2016-01-29 DIAGNOSIS — R590 Localized enlarged lymph nodes: Secondary | ICD-10-CM

## 2016-01-29 DIAGNOSIS — Z79899 Other long term (current) drug therapy: Secondary | ICD-10-CM | POA: Insufficient documentation

## 2016-01-29 HISTORY — PX: MEDIASTINOSCOPY: SHX5086

## 2016-01-29 SURGERY — MEDIASTINOSCOPY
Anesthesia: General

## 2016-01-29 MED ORDER — TRAMADOL HCL 50 MG PO TABS: 50.0000 mg | ORAL_TABLET | Freq: Four times a day (QID) | ORAL | 0 refills | Status: DC | PRN

## 2016-01-29 MED ORDER — DEXTROSE 5 % IV SOLN
1.5000 g | INTRAVENOUS | Status: AC
  Administered 2016-01-29: 1.5 g via INTRAVENOUS
  Filled 2016-01-29: qty 1.5

## 2016-01-29 MED ORDER — SUGAMMADEX SODIUM 200 MG/2ML IV SOLN
INTRAVENOUS | Status: DC | PRN
  Administered 2016-01-29: 150 mg via INTRAVENOUS

## 2016-01-29 MED ORDER — LIDOCAINE HCL (CARDIAC) 20 MG/ML IV SOLN
INTRAVENOUS | Status: DC | PRN
  Administered 2016-01-29: 100 mg via INTRAVENOUS

## 2016-01-29 MED ORDER — TRAMADOL HCL 50 MG PO TABS: 50.0000 mg | ORAL_TABLET | Freq: Four times a day (QID) | ORAL | Status: DC | PRN

## 2016-01-29 MED ORDER — HYDROMORPHONE HCL 1 MG/ML IJ SOLN
0.2500 mg | INTRAMUSCULAR | Status: DC | PRN
  Administered 2016-01-29 (×2): 0.5 mg via INTRAVENOUS

## 2016-01-29 MED ORDER — LACTATED RINGERS IV SOLN
INTRAVENOUS | Status: DC
  Administered 2016-01-29 (×2): via INTRAVENOUS

## 2016-01-29 MED ORDER — HYDROMORPHONE HCL 1 MG/ML IJ SOLN
INTRAMUSCULAR | Status: AC
  Administered 2016-01-29: 0.5 mg via INTRAVENOUS
  Filled 2016-01-29: qty 1

## 2016-01-29 MED ORDER — PROMETHAZINE HCL 25 MG/ML IJ SOLN: 6.2500 mg | INTRAMUSCULAR | Status: DC | PRN

## 2016-01-29 MED ORDER — LIDOCAINE 2% (20 MG/ML) 5 ML SYRINGE
INTRAMUSCULAR | Status: AC
  Filled 2016-01-29: qty 10

## 2016-01-29 MED ORDER — ROCURONIUM BROMIDE 100 MG/10ML IV SOLN
INTRAVENOUS | Status: DC | PRN
  Administered 2016-01-29: 40 mg via INTRAVENOUS

## 2016-01-29 MED ORDER — PROPOFOL 10 MG/ML IV BOLUS
INTRAVENOUS | Status: AC
  Filled 2016-01-29: qty 20

## 2016-01-29 MED ORDER — PHENYLEPHRINE HCL 10 MG/ML IJ SOLN
INTRAVENOUS | Status: DC | PRN
  Administered 2016-01-29: 40 ug/min via INTRAVENOUS

## 2016-01-29 MED ORDER — ONDANSETRON HCL 4 MG/2ML IJ SOLN
INTRAMUSCULAR | Status: DC | PRN
  Administered 2016-01-29: 4 mg via INTRAVENOUS

## 2016-01-29 MED ORDER — ARTIFICIAL TEARS OP OINT
TOPICAL_OINTMENT | OPHTHALMIC | Status: DC | PRN
  Administered 2016-01-29: 1 via OPHTHALMIC

## 2016-01-29 MED ORDER — FENTANYL CITRATE (PF) 100 MCG/2ML IJ SOLN
INTRAMUSCULAR | Status: AC
  Filled 2016-01-29: qty 2

## 2016-01-29 MED ORDER — 0.9 % SODIUM CHLORIDE (POUR BTL) OPTIME
TOPICAL | Status: DC | PRN
  Administered 2016-01-29: 1000 mL

## 2016-01-29 MED ORDER — FENTANYL CITRATE (PF) 100 MCG/2ML IJ SOLN
INTRAMUSCULAR | Status: DC | PRN
  Administered 2016-01-29: 50 ug via INTRAVENOUS

## 2016-01-29 MED ORDER — MIDAZOLAM HCL 2 MG/2ML IJ SOLN
INTRAMUSCULAR | Status: AC
  Filled 2016-01-29: qty 2

## 2016-01-29 MED ORDER — MIDAZOLAM HCL 5 MG/5ML IJ SOLN
INTRAMUSCULAR | Status: DC | PRN
  Administered 2016-01-29: 2 mg via INTRAVENOUS

## 2016-01-29 MED ORDER — PROPOFOL 10 MG/ML IV BOLUS
INTRAVENOUS | Status: DC | PRN
  Administered 2016-01-29: 170 mg via INTRAVENOUS

## 2016-01-29 SURGICAL SUPPLY — 43 items
ADH SKN CLS APL DERMABOND .7 (GAUZE/BANDAGES/DRESSINGS) ×1
APPLIER CLIP LOGIC TI 5 (MISCELLANEOUS) IMPLANT
APR CLP MED LRG 33X5 (MISCELLANEOUS)
BLADE SURG 15 STRL LF DISP TIS (BLADE) ×1 IMPLANT
BLADE SURG 15 STRL SS (BLADE) ×2
CANISTER SUCTION 2500CC (MISCELLANEOUS) ×2 IMPLANT
CLIP TI MEDIUM 6 (CLIP) IMPLANT
CONT SPEC 4OZ CLIKSEAL STRL BL (MISCELLANEOUS) ×7 IMPLANT
COVER SURGICAL LIGHT HANDLE (MISCELLANEOUS) ×4 IMPLANT
DERMABOND ADVANCED (GAUZE/BANDAGES/DRESSINGS) ×1
DERMABOND ADVANCED .7 DNX12 (GAUZE/BANDAGES/DRESSINGS) ×1 IMPLANT
DRAPE CHEST BREAST 15X10 FENES (DRAPES) ×2 IMPLANT
ELECT REM PT RETURN 9FT ADLT (ELECTROSURGICAL) ×2
ELECTRODE REM PT RTRN 9FT ADLT (ELECTROSURGICAL) ×1 IMPLANT
GAUZE SPONGE 4X4 12PLY STRL (GAUZE/BANDAGES/DRESSINGS) ×2 IMPLANT
GAUZE SPONGE 4X4 16PLY XRAY LF (GAUZE/BANDAGES/DRESSINGS) ×2 IMPLANT
GLOVE SURG SIGNA 7.5 PF LTX (GLOVE) ×2 IMPLANT
GOWN STRL REUS W/ TWL LRG LVL3 (GOWN DISPOSABLE) ×1 IMPLANT
GOWN STRL REUS W/ TWL XL LVL3 (GOWN DISPOSABLE) ×1 IMPLANT
GOWN STRL REUS W/TWL LRG LVL3 (GOWN DISPOSABLE) ×2
GOWN STRL REUS W/TWL XL LVL3 (GOWN DISPOSABLE) ×2
HEMOSTAT SURGICEL 2X14 (HEMOSTASIS) IMPLANT
KIT BASIN OR (CUSTOM PROCEDURE TRAY) ×2 IMPLANT
KIT ROOM TURNOVER OR (KITS) ×2 IMPLANT
NS IRRIG 1000ML POUR BTL (IV SOLUTION) ×2 IMPLANT
PACK SURGICAL SETUP 50X90 (CUSTOM PROCEDURE TRAY) ×2 IMPLANT
PAD ARMBOARD 7.5X6 YLW CONV (MISCELLANEOUS) ×4 IMPLANT
PENCIL BUTTON HOLSTER BLD 10FT (ELECTRODE) ×2 IMPLANT
SPONGE INTESTINAL PEANUT (DISPOSABLE) IMPLANT
SUT SILK 2 0 (SUTURE)
SUT SILK 2-0 18XBRD TIE 12 (SUTURE) IMPLANT
SUT VIC AB 2-0 CT1 27 (SUTURE)
SUT VIC AB 2-0 CT1 TAPERPNT 27 (SUTURE) IMPLANT
SUT VIC AB 3-0 SH 18 (SUTURE) ×2 IMPLANT
SUT VICRYL 4-0 PS2 18IN ABS (SUTURE) ×2 IMPLANT
SWAB COLLECTION DEVICE MRSA (MISCELLANEOUS) IMPLANT
SYR BULB 3OZ (MISCELLANEOUS) ×2 IMPLANT
SYRINGE 10CC LL (SYRINGE) ×2 IMPLANT
TOWEL OR 17X24 6PK STRL BLUE (TOWEL DISPOSABLE) ×2 IMPLANT
TOWEL OR 17X26 10 PK STRL BLUE (TOWEL DISPOSABLE) ×2 IMPLANT
TUBE ANAEROBIC SPECIMEN COL (MISCELLANEOUS) IMPLANT
TUBE CONNECTING 12X1/4 (SUCTIONS) ×2 IMPLANT
WATER STERILE IRR 1000ML POUR (IV SOLUTION) ×2 IMPLANT

## 2016-01-29 NOTE — Brief Op Note (Signed)
01/29/2016  3:59 PM  PATIENT:  Robert Morrison  60 y.o. male  PRE-OPERATIVE DIAGNOSIS:  MEDIASTINAL ADENOPATHY  POST-OPERATIVE DIAGNOSIS:  MEDIASTINAL ADENOPATHY, Probable sarcoidosis  PROCEDURE:  Procedure(s): MEDIASTINOSCOPY (N/A)  SURGEON:  Surgeon(s) and Role:    * Melrose Nakayama, MD - Primary  PHYSICIAN ASSISTANT:   ASSISTANTS: none   ANESTHESIA:   general  EBL:  Total I/O In: 1000 [I.V.:1000] Out: 10 [Blood:10]  BLOOD ADMINISTERED:none  DRAINS: none   LOCAL MEDICATIONS USED:  NONE  SPECIMEN:  Source of Specimen:  mediastinal nodes  DISPOSITION OF SPECIMEN:  Pathology and Micro (AFB, fungal)  COUNTS:  YES  PLAN OF CARE: Discharge to home after PACU  PATIENT DISPOSITION:  PACU - hemodynamically stable.   Delay start of Pharmacological VTE agent (>24hrs) due to surgical blood loss or risk of bleeding: not applicable

## 2016-01-29 NOTE — Anesthesia Procedure Notes (Signed)
Procedure Name: Intubation Date/Time: 01/29/2016 2:51 PM Performed by: Izora Gala Pre-anesthesia Checklist: Patient identified, Emergency Drugs available, Suction available and Patient being monitored Patient Re-evaluated:Patient Re-evaluated prior to inductionOxygen Delivery Method: Circle system utilized Preoxygenation: Pre-oxygenation with 100% oxygen Intubation Type: IV induction Ventilation: Mask ventilation without difficulty Laryngoscope Size: Miller and 3 Grade View: Grade I Tube size: 7.5 mm Number of attempts: 1 Airway Equipment and Method: Stylet and LTA kit utilized Placement Confirmation: ETT inserted through vocal cords under direct vision,  positive ETCO2 and breath sounds checked- equal and bilateral Secured at: 22 cm Tube secured with: Tape Dental Injury: Teeth and Oropharynx as per pre-operative assessment

## 2016-01-29 NOTE — Anesthesia Preprocedure Evaluation (Signed)
Anesthesia Evaluation  Patient identified by MRN, date of birth, ID band Patient awake    History of Anesthesia Complications (+) PONV  Airway Mallampati: II  TM Distance: >3 FB Neck ROM: Full    Dental  (+) Teeth Intact   Pulmonary  Hilar adenopathy   breath sounds clear to auscultation       Cardiovascular negative cardio ROS   Rhythm:Regular Rate:Normal     Neuro/Psych negative neurological ROS     GI/Hepatic GERD  ,  Endo/Other  negative endocrine ROS  Renal/GU negative Renal ROS     Musculoskeletal negative musculoskeletal ROS (+)   Abdominal   Peds  Hematology negative hematology ROS (+)   Anesthesia Other Findings   Reproductive/Obstetrics                             Anesthesia Physical Anesthesia Plan  ASA: II  Anesthesia Plan: General   Post-op Pain Management:    Induction: Intravenous  Airway Management Planned: Oral ETT  Additional Equipment:   Intra-op Plan:   Post-operative Plan: Extubation in OR  Informed Consent: I have reviewed the patients History and Physical, chart, labs and discussed the procedure including the risks, benefits and alternatives for the proposed anesthesia with the patient or authorized representative who has indicated his/her understanding and acceptance.   Dental advisory given  Plan Discussed with:   Anesthesia Plan Comments:         Anesthesia Quick Evaluation

## 2016-01-29 NOTE — Discharge Instructions (Addendum)
Do not drive or engage in heavy physical activity for 24 hours  You may resume normal activities and shower tomorrow  You have a prescription for tramadol, a pain reliever, which you may use as directed  You may acetaminophen (Tylenol) or ibuprofen (Advil) in addition to, or instead of, the tramadol  Do not drive within 6 hours of taking tramadol  Call [phone] if you develop chest pain, shortness of breath, fever > 101F or notice excessive pain, swelling, redness or drainage from the incision  My office will contact you with follow up information

## 2016-01-29 NOTE — Transfer of Care (Signed)
Immediate Anesthesia Transfer of Care Note  Patient: Robert Morrison  Procedure(s) Performed: Procedure(s): MEDIASTINOSCOPY (N/A)  Patient Location: PACU  Anesthesia Type:General  Level of Consciousness: awake, alert , oriented and patient cooperative  Airway & Oxygen Therapy: Patient Spontanous Breathing and Patient connected to nasal cannula oxygen  Post-op Assessment: Report given to RN, Post -op Vital signs reviewed and stable, Patient moving all extremities and Patient moving all extremities X 4  Post vital signs: Reviewed and stable  Last Vitals:  Vitals:   01/29/16 1207  BP: 136/76  Pulse: 72  Resp: 18  Temp: 36.8 C    Last Pain:  Vitals:   01/29/16 1207  TempSrc: Oral      Patients Stated Pain Goal: 3 ([id number])  Complications: No apparent anesthesia complications

## 2016-01-30 ENCOUNTER — Encounter (HOSPITAL_COMMUNITY): Payer: Self-pay | Admitting: Thoracic Surgery (Cardiothoracic Vascular Surgery)

## 2016-01-30 NOTE — Op Note (Signed)
NAME:  Robert Morrison, Robert Morrison              ACCOUNT NO.:  [id number]  MEDICAL RECORD NO.:  [id number]  LOCATION:                                 FACILITY:  PHYSICIAN:  Revonda Standard. Roxan Hockey, M.D.DATE OF BIRTH:  1955/03/14  DATE OF PROCEDURE:  01/29/2016 DATE OF DISCHARGE:                              OPERATIVE REPORT   PREOPERATIVE DIAGNOSIS:  Mediastinal adenopathy.  POSTOPERATIVE DIAGNOSIS:  Mediastinal adenopathy, probable sarcoidosis.  PROCEDURE:  Mediastinoscopy.  SURGEON:  Revonda Standard. Roxan Hockey, M.D.  ASSISTANT:  None.  ANESTHESIA:  General.  FINDINGS:  Markedly enlarged 4R lymph nodes.  Frozen section revealed probable granulomas.  CLINICAL NOTE:  [name] is a 60 year old gentleman who has about a 6- month history of cough.  Workup included a CT of the chest, which showed significant mediastinal adenopathy.  Bronchoscopy and endobronchial ultrasound were nondiagnostic.  He was referred for mediastinoscopy for diagnostic purposes.  The indications, risks, benefits, and alternatives were discussed in detail with the patient.  The patient did understand this was a diagnostic and not a therapeutic procedure.  He understood and accepted the risks and agreed to proceed.  OPERATIVE NOTE:  [name] was brought to the operating room on January 29, 2016.  He had induction of general anesthesia.  Sequential compression devices were placed on the calves for DVT prophylaxis. Intravenous antibiotics were administered.  The neck and chest were prepped and draped in usual sterile fashion.  After performing a time- out, a transverse incision was made 1 fingerbreadth above the sternal notch.  Incision was carried through the skin and subcutaneous tissue. Hemostasis was achieved with electrocautery.  The strap muscles were separated in the midline.  The pretracheal fascia was identified and incised and a pretracheal plane was developed bluntly into the mediastinum.  The  mediastinoscope was inserted and the 4R right paratracheal area was inspected.  There were 2 markedly enlarged lymph nodes.  These were white in color, very firm, and fibrous in texture. Biopsies of the first node were taken and sent for frozen section.  The remainder of the node was removed and sent for permanent pathology.  A second node was biopsied and sent for AFB and fungal cultures.  Multiple additional biopsies were taken and sent for permanent pathology.  The wound then was packed with gauze.  After 5 minutes, the gauze packing was removed.  The mediastinoscope was reinserted.  There was good hemostasis.  The mediastinoscope was withdrawn.  The frozen section returned showing inflammation, probably granulomatous disease, most likely sarcoidosis.  The wound was irrigated with saline.  The platysmal layer was closed with interrupted 3-0 Vicryl sutures.  The skin was closed with a 4-0 Vicryl subcuticular suture.  Dermabond was applied to the incision.  The patient was extubated in the operating room and taken to the postanesthetic care unit in good condition.     Revonda Standard Roxan Hockey, M.D.     SCH/MEDQ  D:  01/29/2016  T:  01/30/2016  Job:  [id number]

## 2016-01-31 NOTE — Anesthesia Postprocedure Evaluation (Signed)
Anesthesia Post Note  Patient: AANSH NORDMANN  Procedure(s) Performed: Procedure(s) (LRB): MEDIASTINOSCOPY (N/A)  Patient location during evaluation: PACU Anesthesia Type: General Level of consciousness: awake and alert Pain management: pain level controlled Vital Signs Assessment: post-procedure vital signs reviewed and stable Respiratory status: spontaneous breathing, nonlabored ventilation, respiratory function stable and patient connected to nasal cannula oxygen Cardiovascular status: blood pressure returned to baseline and stable Postop Assessment: no signs of nausea or vomiting Anesthetic complications: no    Last Vitals:  Vitals:   01/29/16 1645 01/29/16 1704  BP: 111/73 109/74  Pulse: 78 73  Resp:  18  Temp: 36.7 C     Last Pain:  Vitals:   01/29/16 1704  TempSrc:   PainSc: 0-No pain                 Cartez Mogle,JAMES TERRILL

## 2016-02-01 LAB — ACID FAST SMEAR (AFB, MYCOBACTERIA)

## 2016-02-01 LAB — ACID FAST SMEAR (AFB): ACID FAST SMEAR - AFSCU2: NEGATIVE

## 2016-02-02 ENCOUNTER — Telehealth: Payer: Self-pay | Admitting: Pulmonary Disease

## 2016-02-02 NOTE — Telephone Encounter (Signed)
LMOMTCB x 1 

## 2016-02-02 NOTE — Telephone Encounter (Signed)
Pt. Returned our call and stated he spoke with someone from Dr. Skipper Cliche office and he stated someone from their office is suppose to call us and see if we can move the pt, to a sooner appointment with BQ. I informed the pt. As of right now no one has called [place] about this and as soon as we have all the information then we will follow back up with him.

## 2016-02-03 ENCOUNTER — Telehealth: Payer: Self-pay | Admitting: Pulmonary Disease

## 2016-02-03 NOTE — Telephone Encounter (Signed)
Error there had already been a msg taken

## 2016-02-04 ENCOUNTER — Telehealth: Payer: Self-pay

## 2016-02-04 NOTE — Telephone Encounter (Signed)
lmtcb X1 for patient. He is scheduled to see MW tomorrow.   Will forward to both BQ and MW to make aware.

## 2016-02-04 NOTE — Telephone Encounter (Signed)
-----   Message from Juanito Doom, MD sent at 02/04/2016  3:50 AM EST ----- Fran Lowes,  His pathology report showed sarcoidosis.  This is good news for him.   Can you: Tell him that we would like to see him in clinic ASAP? Call in a prescription for prednisone 30mg  daily dispense enough for 14 days worth until he sees [place] in clinic. The plan will be for 3 months of therapy: 30mg  daily for a month, then 20mg  daily for a month, then 10mg  daily for a month.  Am cc'ing Tammy and Judson Roch in case either of them see him.  Thanks [employer]

## 2016-02-05 ENCOUNTER — Ambulatory Visit (INDEPENDENT_AMBULATORY_CARE_PROVIDER_SITE_OTHER): Payer: 59 | Admitting: *Deleted

## 2016-02-05 DIAGNOSIS — R590 Localized enlarged lymph nodes: Secondary | ICD-10-CM

## 2016-02-05 DIAGNOSIS — Z5189 Encounter for other specified aftercare: Secondary | ICD-10-CM

## 2016-02-05 DIAGNOSIS — D860 Sarcoidosis of lung: Secondary | ICD-10-CM

## 2016-02-05 DIAGNOSIS — Z9889 Other specified postprocedural states: Secondary | ICD-10-CM

## 2016-02-05 NOTE — Progress Notes (Signed)
Robert Morrison had MEDIASTINOSCOPY on 01/29/16 by Dr. Roxan Hockey. He returns for check of the surgical site. Th mediastinoscopy incision is well healed with dermabond intact. There is very minimal swelling and no redness. He does complain of some soreness with head turning and extension but was reassured. He will see Dr. Melvyn Novas on 12/27 to discuss treatment. He will return PRN only. Dr. Roxan Hockey has been made aware of the post op plans and progress.

## 2016-02-06 ENCOUNTER — Encounter: Payer: Self-pay | Admitting: Internal Medicine

## 2016-02-06 ENCOUNTER — Ambulatory Visit (INDEPENDENT_AMBULATORY_CARE_PROVIDER_SITE_OTHER): Payer: 59 | Admitting: Internal Medicine

## 2016-02-06 VITALS — BP 118/78 | HR 70 | Ht 66.0 in | Wt 163.0 lb

## 2016-02-06 DIAGNOSIS — D860 Sarcoidosis of lung: Secondary | ICD-10-CM

## 2016-02-06 DIAGNOSIS — R05 Cough: Secondary | ICD-10-CM | POA: Diagnosis not present

## 2016-02-06 DIAGNOSIS — R059 Cough, unspecified: Secondary | ICD-10-CM

## 2016-02-06 MED ORDER — PREDNISONE 10 MG PO TABS: ORAL_TABLET | ORAL | 0 refills | Status: DC

## 2016-02-06 MED ORDER — FAMOTIDINE 20 MG PO TABS: ORAL_TABLET | ORAL | 2 refills | Status: DC

## 2016-02-06 NOTE — Assessment & Plan Note (Signed)
Dx 01/29/16 by mediastinoscopy  Started daily prednisone 02/06/2016 >>>   The goal with a chronic steroid dependent illness is always arriving at the lowest effective dose that controls the disease/symptoms and not accepting a set "formula" which is based on statistics or guidelines that don't always take into account patient  variability or the natural hx of the dz in every individual patient, which may well vary over time.  For now therefore I recommend the patient maintain  20 mg per day ceiling and 10 mg per day floor until f/u with Dr Lake Bells  Note cough is not typical of sarcoid as does not occur on insp or exp and likely at this point is at least partly cyclical/uacs (see separate a/p)

## 2016-02-06 NOTE — Patient Instructions (Addendum)
Prednisone 10 mg x 2 each am with breakfast then reduce to 1 daily until you return to see Dr Lake Bells   Continue Try prilosec 40mg   Take 30-60 min before first meal of the day and Pepcid ac (famotidine) 20 mg one @  bedtime until cough is completely gone for at least a week without the need for cough suppression   GERD (REFLUX)  is an extremely common cause of respiratory symptoms just like yours , many times with no obvious heartburn at all.    It can be treated with medication, but also with lifestyle changes including elevation of the head of your bed (ideally with 6 inch  bed blocks),  Smoking cessation, avoidance of late meals, excessive alcohol, and avoid fatty foods, chocolate, peppermint, colas, red wine, and acidic juices such as orange juice.  NO MINT OR MENTHOL PRODUCTS SO NO COUGH DROPS   USE SUGARLESS CANDY INSTEAD (Jolley ranchers or Stover's or Life Savers) or even ice chips will also do - the key is to swallow to prevent all throat clearing. NO OIL BASED VITAMINS - use powdered substitutes.    Please schedule a follow up office visit in 6 weeks, call sooner if needed with Dr Lake Bells

## 2016-02-06 NOTE — Progress Notes (Signed)
Subjective:    Patient ID: Robert Morrison, male    DOB: 01/26/1956, 60 y.o.   MRN: [id number]  Synopsis: Never smoker Self-referral in 2017 for cough. A CT scan of the chest had been performed prior to that visit showing mediastinal lymphadenopathy. Underwent endobronchial ultrasound-guided fine-needle aspiration and BAL and endobronchial biopsy in October 2017 all of which were negative. Appears to have acid reflux. Cough improved with antacid treatment.  01/13/16 ov /Dr McQuaid Chief Complaint  Patient presents with  . Follow-up    Pt states his SOB has worsened since last OV, and increase in prod cough with yellow mucus and constant headache. Pt also c/o decrease in appetite and insomnia.   rec Cough: Resume gastroesophageal reflux treatment with over-the-counter pantoprazole Use the cough syrup with codeine as needed Use albuterol as needed for wheezing For the mediastinal lymphadenopathy: We will refer you to thoracic surgery for further evaluation and a mediastinoscopy > Pos NCG 01/29/16     02/06/2016  f/u ov/Robert Morrison re: sarcoidosis plus ?UACS  Chief Complaint  Patient presents with  . Follow-up    [name] Medication refills   throat irritated since surgery/ constant daytime urge to clear it/ no longer on gerd rx no overt sinus symptoms, not better on clariton  Not limited by breathing from desired activities    No obvious day to day or daytime variability or assoc excess/ purulent sputum or mucus plugs or hemoptysis or cp or chest tightness, subjective wheeze or overt sinus or hb symptoms. No unusual exp hx or h/o childhood pna/ asthma or knowledge of premature birth.  Sleeping ok without nocturnal  or early am exacerbation  of respiratory  c/o's or need for noct saba. Also denies any obvious fluctuation of symptoms with weather or environmental changes or other aggravating or alleviating factors except as outlined above   Current Medications, Allergies, Complete  Past Medical History, Past Surgical History, Family History, and Social History were reviewed in [employer] record.  ROS  The following are not active complaints unless bolded sore throat, dysphagia, dental problems, itching, sneezing,  nasal congestion or excess/ purulent secretions, ear ache,   fever, chills, sweats, unintended wt loss, classically pleuritic or exertional cp,  orthopnea pnd or leg swelling, presyncope, palpitations, abdominal pain, anorexia, nausea, vomiting, diarrhea  or change in bowel or bladder habits, change in stools or urine, dysuria,hematuria,  rash, arthralgias, visual complaints, headache, numbness, weakness or ataxia or problems with walking or coordination,  change in mood/affect or memory.               Objective:   Physical Exam   amb wm nad with freq throat clearing/ hacking dry upper airway cough   Wt Readings from Last 3 Encounters:  02/06/16 163 lb (73.9 kg)  01/29/16 162 lb 14.7 oz (73.9 kg)  01/27/16 162 lb 14.7 oz (73.9 kg)    Vital signs reviewed  - Note on arrival 02 sats  97% on RA    HEENT: nl dentition, turbinates, and oropharynx. Nl external ear canals without cough reflex   NECK :  without JVD/Nodes/TM/ nl carotid upstrokes bilaterally   LUNGS: no acc muscle use,  Nl contour chest which is clear to A and P bilaterally without cough on insp or exp maneuvers   CV:  RRR  no s3 or murmur or increase in P2, nad no edema   ABD:  soft and nontender with nl inspiratory excursion in the supine position. No  bruits or organomegaly appreciated, bowel sounds nl  MS:  Nl gait/ ext warm without deformities, calf tenderness, cyanosis or clubbing No obvious joint restrictions   SKIN: warm and dry without lesions    NEURO:  alert, approp, nl sensorium with  no motor or cerebellar deficits apparent.            Assessment & Plan:

## 2016-02-06 NOTE — Assessment & Plan Note (Signed)
Upper airway cough syndrome (previously labeled PNDS) , is  so named because it's frequently impossible to sort out how much is  CR/sinusitis with freq throat clearing (which can be related to primary GERD)   vs  causing  secondary (" extra esophageal")  GERD from wide swings in gastric pressure that occur with throat clearing, often  promoting self use of mint and menthol lozenges that reduce the lower esophageal sphincter tone and exacerbate the problem further in a cyclical fashion.   These are the same pts (now being labeled as having "irritable larynx syndrome" by some cough centers) who not infrequently have a history of having failed to tolerate ace inhibitors,  dry powder inhalers or biphosphonates or report having atypical/extraesophageal reflux symptoms that don't respond to standard doses of PPI  and are easily confused as having aecopd or asthma flares by even experienced allergists/ pulmonologists (myself included).  Of the three most common causes of chronic cough, only one (GERD)  can actually cause the other two (asthma and post nasal drip syndrome)  and perpetuate the cylce of cough inducing airway trauma, inflammation, heightened sensitivity to reflux which is prompted by the cough itself via a cyclical mechanism.    This may partially respond to steroids and look like asthma and post nasal drainage but never erradicated completely unless the cough and the secondary reflux are eliminated, preferably both at the same time.  While not intuitively obvious, many patients with chronic low grade reflux do not cough until there is a secondary insult that disturbs the protective epithelial barrier and exposes sensitive nerve endings.  This can be viral or direct physical injury such as with an endotracheal tube.   The point is that once this occurs, it is difficult to eliminate using anything but a maximally effective acid suppression regimen at least in the short run, accompanied by an appropriate  diet to address non acid GERD.   rec max rx for gerd/ f/u Dr Lake Bells in 6 weeks  I had an extended discussion with the patient reviewing all relevant studies completed to date and  lasting 15 to 20 minutes of a 25 minute visit  In pt not previously known to me   Each maintenance medication was reviewed in detail including most importantly the difference between maintenance and prns and under what circumstances the prns are to be triggered using an action plan format that is not reflected in the computer generated alphabetically organized AVS.    Please see AVS for unique instructions that I personally wrote and verbalized to the the pt in detail and then reviewed with pt  by my nurse highlighting any  changes in therapy recommended at today's visit to their plan of care.

## 2016-02-10 NOTE — H&P (Signed)
PCP is Precious Reel, MD Referring Provider is Juanito Doom, MD      Chief Complaint  Patient presents with  . Adenopathy    mediastinal..endobronchial bx neg on 12/09/15, TC CHEST 11/20/15,PFT 10/21/15    HPI: Mr Popoca is a 60 year old man with a past medical history of hypertension and hyperlipidemia. Back in July he developed a cough. He was treated with antibiotics and steroids and his cough improved, but recurred shortly after stopping treatment. He had another round of steroids and again had the same response. Since then he's had a consistent severe cough, usually nonproductive. He had a bronchoscopy and endobronchial ultrasound by Dr. Lake Bells in October which was nondiagnostic. Last week he had 2-3 days where he had fevers, chills, and night sweats. He has not had any of those before or after that. He is hoarse. He has lost about 10 pounds over the past 6 months. He's hungry but food does not taste right to him.  Zubrod Score: At the time of surgery this patient's most appropriate activity status/level should be described as: []     0    Normal activity, no symptoms [x]     1    Restricted in physical strenuous activity but ambulatory, able to do out light work []     2    Ambulatory and capable of self care, unable to do work activities, up and about >50 % of waking hours                              []     3    Only limited self care, in bed greater than 50% of waking hours []     4    Completely disabled, no self care, confined to bed or chair []     5    Moribund    Past Medical History:  Diagnosis Date  . Hyperlipidemia   . Hypertension    patient states has white coat syndrome  . PONV (postoperative nausea and vomiting)          Past Surgical History:  Procedure Laterality Date  . COLONOSCOPY  12-05-2009  . ELBOW FRACTURE SURGERY Left 1998  . ENDOBRONCHIAL ULTRASOUND Bilateral 12/08/2015   Procedure: ENDOBRONCHIAL ULTRASOUND;  Surgeon: Juanito Doom, MD;  Location: [facility];  Service: Cardiopulmonary;  Laterality: Bilateral;  . POLYPECTOMY    . ROTATOR CUFF REPAIR Left 2010         Family History  Problem Relation Age of Onset  . Colon polyps Sister   . Colon cancer Neg Hx     Social History        Social History   Substance Use Topics   . Smoking status: Never Smoker   . Smokeless tobacco: Never Used   . Alcohol use 0.0 oz/week     Comment: rarely           Current Outpatient Prescriptions  Medication Sig Dispense Refill  . albuterol (PROVENTIL HFA;VENTOLIN HFA) 108 (90 Base) MCG/ACT inhaler Inhale 2 puffs into the lungs every 6 (six) hours as needed for wheezing or shortness of breath. 1 Inhaler 6  . azithromycin (ZITHROMAX) 500 MG tablet Take by mouth daily. FOR THREE DAYS    . loratadine (CLARITIN) 10 MG tablet Take 10 mg by mouth daily.    Marland Kitchen omeprazole (PRILOSEC) 20 MG capsule Take 20 mg by mouth daily.    . predniSONE (STERAPRED UNI-PAK 21 TAB) 10  MG (21) TBPK tablet Take 10 mg by mouth daily.    . promethazine-codeine (PHENERGAN WITH CODEINE) 6.25-10 MG/5ML syrup Take 5 mLs by mouth every 6 (six) hours as needed for cough. 120 mL 0   Current Facility-Administered Medications  Medication Dose Route Frequency Provider Last Rate Last Dose  . promethazine-codeine (PHENERGAN with CODEINE) 6.25-10 MG/5ML syrup 5 mL  5 mL Oral Q6H PRN Juanito Doom, MD             Allergies  Allergen Reactions  . Hydrocodone-Acetaminophen     REACTION: rash    Review of Systems  Constitutional: Positive for chills, diaphoresis, fever and unexpected weight change. Negative for appetite change.  HENT: Positive for voice change. Negative for trouble swallowing.   Eyes: Negative for visual disturbance.  Respiratory: Positive for cough. Negative for shortness of breath and wheezing.   Cardiovascular: Negative for chest pain.  Gastrointestinal: Positive for abdominal pain (Upper abdominal  strain from coughing). Negative for blood in stool.  Genitourinary: Negative for difficulty urinating and dysuria.  Musculoskeletal: Negative for arthralgias and myalgias.  Neurological: Negative for seizures and syncope.  Hematological: Positive for adenopathy (CT). Does not bruise/bleed easily.  All other systems reviewed and are negative.   BP 129/86 (BP Location: Right Arm, Patient Position: Sitting, Cuff Size: Large)   Pulse 96   Resp 16   Ht 5\' 6"  (1.676 m)   Wt 165 lb (74.8 kg)   SpO2 94% Comment: ON RA  BMI 26.63 kg/m  Physical Exam  Constitutional: He is oriented to person, place, and time. He appears well-developed and well-nourished. No distress.  HENT:  Head: Normocephalic and atraumatic.  Mouth/Throat: No oropharyngeal exudate.  Eyes: Conjunctivae and EOM are normal. No scleral icterus.  Neck: Neck supple. No thyromegaly present.  Cardiovascular: Normal rate, regular rhythm, normal heart sounds and intact distal pulses.   No murmur heard. Pulmonary/Chest: Effort normal and breath sounds normal. No respiratory distress. He has no wheezes. He has no rales.  Abdominal: Soft. He exhibits no distension. There is no tenderness.  Musculoskeletal: He exhibits no edema.  Lymphadenopathy:    He has no cervical adenopathy.  Neurological: He is alert and oriented to person, place, and time. No cranial nerve deficit.  Skin: Skin is warm and dry.  Vitals reviewed.    Diagnostic Tests: CT CHEST WITHOUT CONTRAST  TECHNIQUE: Multidetector CT imaging of the chest was performed following the standard protocol without intravenous contrast. High resolution imaging of the lungs, as well as inspiratory and expiratory imaging, was performed.  COMPARISON: None.  FINDINGS: Cardiovascular: Normal heart size. No significant pericardial fluid/thickening. Left anterior descending coronary atherosclerosis. Atherosclerotic nonaneurysmal thoracic aorta. Normal  caliber pulmonary arteries.  Mediastinum/Nodes: Suggestion of a 1.0 cm hypodense right thyroid lobe nodule. Unremarkable esophagus. No axillary adenopathy. Mild right paratracheal adenopathy measuring up to 1.7 cm (series 4/ image 43). AP window adenopathy measuring up to 1.2 cm (series 4/ image 53) enlarged 1.8 cm subcarinal node (series 4/ image 69). Enlarged 1.0 cm left internal mammary node (series 4/ image 33). Mild to moderate bilateral hilar adenopathy, poorly delineated on this noncontrast study.  Lungs/Pleura: No pneumothorax. No pleural effusion. There a few scattered central peribronchial solid nodules in both lungs, for example a 9 mm central right lower lobe solid nodule (series 5/image 90), a 7 mm central left upper lobe solid nodule (series 5/ image 62) and a 7 mm central left lower lobe solid nodule (series 5/ image 92). There is  a 10 mm solid subpleural pulmonary nodule in the medial left upper lobe (series 5/image 68). There are a few additional tiny solid pulmonary nodules associated with the peripheral peribronchovascular structures, for example a 5 mm posterior left upper lobe nodule (series 5/ image 63). No acute consolidative airspace disease or lung masses. No significant regions of subpleural reticulation, ground-glass attenuation, traction bronchiectasis, architectural distortion, parenchymal banding or frank honeycombing. No significant air trapping on the expiration sequence.  Upper abdomen: Unremarkable.  Musculoskeletal: No aggressive appearing focal osseous lesions. Mild thoracic spondylosis.  IMPRESSION: 1. Moderate mediastinal and bilateral hilar lymphadenopathy. Several scattered solid pulmonary nodules in both lungs, the largest measuring 10 mm in the subpleural medial left upper lobe, although most of which are located in the peribronchial central lungs and measure up to 9 mm in the right lower lobe. Leading diagnostic considerations are  sarcoidosis or lymphoma. Tissue sampling and/or short-term follow-up chest CT with IV contrast is advised. 2. No evidence of interstitial lung disease. 3. Possible 1.0 cm right thyroid lobe nodule, for which no further evaluation is required. This follows ACR consensus guidelines: Managing Incidental Thyroid Nodules Detected on Imaging: White Paper of the ACR Incidental Thyroid Findings Committee. J Am Coll Radiol 2015; 12:143-150. 4. Additional findings include aortic atherosclerosis and 1 vessel coronary atherosclerosis.   Electronically Signed By: Ilona Sorrel M.D. On: 11/20/2015 11:14 I personally reviewed the CT and concur with the findings noted above.  Impression: Mr. Reiner is a 60 year old man with a 6 month history of a persistent cough and mediastinal and hilar adenopathy on CT. He also has several small pulmonary nodules bilaterally. The differential diagnosis primarily comes down to sarcoidosis versus lymphoma, although AFB or fungal infections are also a possibility.  He had a nondiagnostic bronchoscopy and endobronchial ultrasound, which is not surprising in this setting. I think the best option at this point would be to proceed with mediastinal endoscopy for biopsy to try to establish a definitive diagnosis.  I had a long discussion with Mr. and Mrs. Rosana Berger. I reviewed the films with them. I discussed the procedure mediastinal endoscopy with them. They understand this would be done on an outpatient basis but in the operating room under general anesthesia. They understand the incision to be made. I informed them of the indications, risks, benefits, and alternatives. They understand the risks include, but are not limited to death, MI, DVT, PE, bleeding, possible need for transfusion or thoracotomy, infection, stroke, recurrent nerve injury leading to permanent hoarseness, and pneumothorax. They also understand there is a possibility of other unforeseeable,  complications. They do understand that this is a low risk procedure.  He expressed some reluctance initially but then decided he would like to proceed on Thursday, 01/29/2016  Plan: Mediastinoscopy on Thursday, 01/29/2016.  Melrose Nakayama, MD [facility] [phone]

## 2016-02-19 ENCOUNTER — Telehealth: Payer: Self-pay | Admitting: Pulmonary Disease

## 2016-02-19 LAB — CULTURE, FUNGUS WITHOUT SMEAR

## 2016-02-19 NOTE — Telephone Encounter (Signed)
Spoke with pt. He needed some clarification on how to take his medication from his appointment with MW. I have gone over MW's instructions with the pt. All of his questions were answered. Nothing further was needed at this time.

## 2016-03-01 ENCOUNTER — Telehealth: Payer: Self-pay | Admitting: Internal Medicine

## 2016-03-02 ENCOUNTER — Ambulatory Visit: Payer: 59 | Admitting: Pulmonary Disease

## 2016-03-08 ENCOUNTER — Telehealth: Payer: Self-pay | Admitting: Internal Medicine

## 2016-03-08 NOTE — Telephone Encounter (Signed)
Called and spoke to pt. Informed him of the recs per MW. Pt verbalized understanding and denied any further questions or concerns at this time.   

## 2016-03-08 NOTE — Telephone Encounter (Signed)
Spoke with pt, who states he has developed a cold over the weekend. Pt c/o post nasal drip, prod cough with clear mucus 7 sore throat. Pt seen MW for acute visit on  02-06-16. Pt was instructed to take Prednisone 10 mg x 2 each am with breakfast then reduce to 1 daily until he returned to see BQ 03-19-16. Pt states once he started taking 10mg  bid, his cough improved. Pt states one week after taking 10mg  prednisone, his cough then returned. Pt would like to know if he can continue taking 20mg  prednisone until his return OV with BQ.  MW please advise. Thanks.

## 2016-03-08 NOTE — Telephone Encounter (Signed)
Ok to increase prednisone 10 mg 2 each am until better then ok to try 20/10 alternating days

## 2016-03-15 LAB — ACID FAST CULTURE WITH REFLEXED SENSITIVITIES

## 2016-03-15 LAB — ACID FAST CULTURE WITH REFLEXED SENSITIVITIES (MYCOBACTERIA): Acid Fast Culture: NEGATIVE

## 2016-03-19 ENCOUNTER — Ambulatory Visit (INDEPENDENT_AMBULATORY_CARE_PROVIDER_SITE_OTHER): Payer: 59 | Admitting: Pulmonary Disease

## 2016-03-19 ENCOUNTER — Encounter: Payer: Self-pay | Admitting: Pulmonary Disease

## 2016-03-19 VITALS — BP 144/72 | HR 95 | Ht 66.0 in | Wt 172.0 lb

## 2016-03-19 DIAGNOSIS — D860 Sarcoidosis of lung: Secondary | ICD-10-CM

## 2016-03-19 DIAGNOSIS — R590 Localized enlarged lymph nodes: Secondary | ICD-10-CM | POA: Diagnosis not present

## 2016-03-19 DIAGNOSIS — R05 Cough: Secondary | ICD-10-CM | POA: Diagnosis not present

## 2016-03-19 DIAGNOSIS — R059 Cough, unspecified: Secondary | ICD-10-CM

## 2016-03-19 MED ORDER — PREDNISONE 10 MG PO TABS: ORAL_TABLET | ORAL | 0 refills | Status: DC

## 2016-03-19 NOTE — Patient Instructions (Signed)
For your acid reflux: Keep taking pantoprazole daily Keep taking Pepcid at night Follow the acid reflux lifestyle modification sheet  For your sarcoidosis: We will repeat a chest x-ray in 6 weeks when you return Take prednisone 10 mg daily for two days, then take 20mg  daily on day three, then resume 10mg  daily for two days, then 20mg  daily for one day.  Repeat this pattern for two weeks, then take 10mg  daily until you see [place] next  We will see you back in 6 weeks

## 2016-03-19 NOTE — Progress Notes (Signed)
Subjective:    Patient ID: Robert Morrison, male    DOB: 09/22/1955, 61 y.o.   MRN: [id number]  Synopsis: Self-referral in 2017 for cough and was ultimately diagnosed with sarcoidosis based on a mediastinal lymph node recestion in 2017. A CT scan of the chest had been performed prior to that visit showing mediastinal lymphadenopathy. Underwent endobronchial ultrasound-guided fine-needle aspiration and BAL and endobronchial biopsy in October 2017 all of which was negative. Appears to have acid reflux. Cough improved with antacid treatment.  HPI Chief Complaint  Patient presents with  . Follow-up    cough is improved- pt states cough is resolved.    Robert Morrison has been doing much better since the last visit.  He had a nasty infection after his surgery which sounds like a viral problem.  He says that overall he is 100% better.  He says that his cough has resolved.  His scar from surgery has resolved.  He saw Dr. Melvyn Novas in December and had his prednisone adjusted.  He says that when he reduced his prednisone to 10mg  daily he had a recurrence of his cough.  Because of this he has started taking 20mg  every other day and 10mg  every other day.  He had a cough like symptoms and felt like he had a cold with heavy lymph node swelling in December.    He started taking prednisone in December 2017.  Past Medical History:  Diagnosis Date  . Allergy    takes Claritin daily  . GERD (gastroesophageal reflux disease)    takes OMeprazole daily  . History of bronchitis    around 08  . History of colon polyps    benign  . Hyperlipidemia   . Pneumonia    hx of couple of yrs ago  . PONV (postoperative nausea and vomiting)       Review of Systems  Constitutional: Positive for chills and fatigue. Negative for fever.  HENT: Positive for nosebleeds, postnasal drip and rhinorrhea.   Respiratory: Positive for cough, shortness of breath and wheezing.   Cardiovascular: Negative for chest pain, palpitations and  leg swelling.       Objective:   Physical Exam Vitals:   03/19/16 1224  BP: (!) 144/72  Pulse: 95  SpO2: 97%  Weight: 172 lb (78 kg)  Height: 5\' 6"  (1.676 m)   RA  Gen: well appearing HENT: OP clear, TM's clear, neck supple PULM: CTA B, normal percussion CV: RRR, no mgr, trace edema GI: BS+, soft, nontender Derm: no cyanosis or rash Psyche: normal mood and affect    CBC    Component Value Date/Time   WBC 10.9 (H) 01/27/2016 1521   RBC 5.50 01/27/2016 1521   HGB 14.5 01/27/2016 1521   HCT 45.0 01/27/2016 1521   PLT 423 (H) 01/27/2016 1521   MCV 81.8 01/27/2016 1521   MCH 26.4 01/27/2016 1521   MCHC 32.2 01/27/2016 1521   RDW 13.9 01/27/2016 1521   LYMPHSABS 1.0 01/13/2016 1330   MONOABS 1.0 01/13/2016 1330   EOSABS 0.0 01/13/2016 1330   BASOSABS 0.0 01/13/2016 1330        Assessment & Plan:   Impression: Sarcoidosis Cough Acid reflux  Discussion: Infant is doing remarkably well since starting treatment for sarcoidosis. Fortunately he has very little pulmonary parenchymal disease so he should have a good prognosis. I think his cough is still related to acid reflux to some degree and he is not following any lifestyle modification changes for GERD.  For  your acid reflux: Keep taking pantoprazole daily Keep taking Pepcid at night Follow the acid reflux lifestyle modification sheet  For your sarcoidosis: We will repeat a chest x-ray in 6 weeks when you return Take prednisone 10 mg daily for two days, then take 20mg  daily on day three, then resume 10mg  daily for two days, then 20mg  daily for one day.  Repeat this pattern for two weeks, then take 10mg  daily until you see [place] next  We will see you back in 6 weeks     Current Outpatient Prescriptions:  .  acetaminophen (TYLENOL) 500 MG tablet, Take 500 mg by mouth every 6 (six) hours as needed (for pain.)., Disp: , Rfl:  .  albuterol (PROVENTIL HFA;VENTOLIN HFA) 108 (90 Base) MCG/ACT inhaler, Inhale 2  puffs into the lungs every 6 (six) hours as needed for wheezing or shortness of breath., Disp: 1 Inhaler, Rfl: 6 .  famotidine (PEPCID) 20 MG tablet, One at bedtime, Disp: 30 tablet, Rfl: 2 .  loratadine (CLARITIN) 10 MG tablet, Take 10 mg by mouth daily., Disp: , Rfl:  .  omeprazole (PRILOSEC) 40 MG capsule, Take 40 mg by mouth daily. , Disp: , Rfl:  .  predniSONE (DELTASONE) 10 MG tablet, Take  2 each am with breakfast until better then 1 daily (Patient taking differently: Take 10 mg by mouth daily with breakfast. Alternates 20mg  and 10mg  qod.), Disp: 100 tablet, Rfl: 0 .  traMADol (ULTRAM) 50 MG tablet, Take 1-2 tablets (50-100 mg total) by mouth every 6 (six) hours as needed for moderate pain or severe pain., Disp: 30 tablet, Rfl: 0  Current Facility-Administered Medications:  .  promethazine-codeine (PHENERGAN with CODEINE) 6.25-10 MG/5ML syrup 5 mL, 5 mL, Oral, Q6H PRN, Juanito Doom, MD

## 2016-04-13 DIAGNOSIS — D225 Melanocytic nevi of trunk: Secondary | ICD-10-CM | POA: Diagnosis not present

## 2016-04-13 DIAGNOSIS — I781 Nevus, non-neoplastic: Secondary | ICD-10-CM | POA: Diagnosis not present

## 2016-04-19 DIAGNOSIS — D8689 Sarcoidosis of other sites: Secondary | ICD-10-CM | POA: Diagnosis not present

## 2016-04-19 DIAGNOSIS — R05 Cough: Secondary | ICD-10-CM | POA: Diagnosis not present

## 2016-04-19 DIAGNOSIS — R7309 Other abnormal glucose: Secondary | ICD-10-CM | POA: Diagnosis not present

## 2016-04-19 DIAGNOSIS — R030 Elevated blood-pressure reading, without diagnosis of hypertension: Secondary | ICD-10-CM | POA: Diagnosis not present

## 2016-04-19 DIAGNOSIS — K219 Gastro-esophageal reflux disease without esophagitis: Secondary | ICD-10-CM | POA: Diagnosis not present

## 2016-05-05 ENCOUNTER — Other Ambulatory Visit: Payer: Self-pay | Admitting: Internal Medicine

## 2016-05-05 DIAGNOSIS — D860 Sarcoidosis of lung: Secondary | ICD-10-CM

## 2016-05-11 ENCOUNTER — Encounter: Payer: Self-pay | Admitting: Pulmonary Disease

## 2016-05-11 ENCOUNTER — Ambulatory Visit (INDEPENDENT_AMBULATORY_CARE_PROVIDER_SITE_OTHER): Payer: 59 | Admitting: Pulmonary Disease

## 2016-05-11 ENCOUNTER — Ambulatory Visit (INDEPENDENT_AMBULATORY_CARE_PROVIDER_SITE_OTHER)
Admission: RE | Admit: 2016-05-11 | Discharge: 2016-05-11 | Disposition: A | Discharge status: Home / Self Care | Payer: 59 | Source: Ambulatory Visit | Attending: Pulmonary Disease | Admitting: Pulmonary Disease

## 2016-05-11 DIAGNOSIS — R918 Other nonspecific abnormal finding of lung field: Secondary | ICD-10-CM | POA: Diagnosis not present

## 2016-05-11 DIAGNOSIS — D860 Sarcoidosis of lung: Secondary | ICD-10-CM

## 2016-05-11 DIAGNOSIS — R05 Cough: Secondary | ICD-10-CM | POA: Diagnosis not present

## 2016-05-11 DIAGNOSIS — R059 Cough, unspecified: Secondary | ICD-10-CM

## 2016-05-11 NOTE — Assessment & Plan Note (Signed)
Stable interval without worsening disease. At this point, the likelihood of recurrence is less than 10%. He is at a point now where prednisone is causing more harm than good. Today we talked about the fact that sarcoidosis can affect any tissue in the body including the heart, kidneys, lungs, eyes. I advised that he talk to his ophthalmologist about his diagnosis and whenever red flag signs of severe ocular sarcoidosis.  Plan: Stopped prednisone with a quick taper over the next 5 days Chest x-ray today Follow-up with me in one year or sooner if he develops respiratory symptoms Follow-up with ophthalmology

## 2016-05-11 NOTE — Progress Notes (Signed)
Subjective:    Patient ID: Robert Morrison, male    DOB: 1955-05-14, 61 y.o.   MRN: [id number]  Synopsis: Self-referral in 2017 for cough and was ultimately diagnosed with sarcoidosis based on a mediastinal lymph node recestion in 2017. A CT scan of the chest had been performed prior to that visit showing mediastinal lymphadenopathy. Underwent endobronchial ultrasound-guided fine-needle aspiration and BAL and endobronchial biopsy in October 2017 all of which was negative. Appears to have acid reflux. Cough improved with antacid treatment.  Ultimately a mediastinoscopy showed non-caseating granulomas in 01/2016.      HPI Chief Complaint  Patient presents with  . Follow-up    pt doing well, denies complaints since starting prednisone.  wants to discuss weaning off of prednisone.    Terril ahs been OK.  He says that he hasn't had the cough or dyspnea.  He has been taking 10mg  prednisone for several weeks now, daily.  He denies respiratory problems.  He saw Dr. Virgina Jock recently.  He says that the prednisone is making him gain weight and he has a tremor in his hands as well.  He has a ferocious appetite.    Past Medical History:  Diagnosis Date  . Allergy    takes Claritin daily  . GERD (gastroesophageal reflux disease)    takes OMeprazole daily  . History of bronchitis    around 08  . History of colon polyps    benign  . Hyperlipidemia   . Pneumonia    hx of couple of yrs ago  . PONV (postoperative nausea and vomiting)       Review of Systems  Constitutional: Positive for chills and fatigue. Negative for fever.  HENT: Positive for nosebleeds, postnasal drip and rhinorrhea.   Respiratory: Positive for cough, shortness of breath and wheezing.   Cardiovascular: Negative for chest pain, palpitations and leg swelling.       Objective:   Physical Exam Vitals:   05/11/16 1434  BP: (!) 148/84  Pulse: 79  SpO2: 97%  Weight: 178 lb (80.7 kg)  Height: 5\' 6"  (1.676 m)   RA  Gen:  well appearing HENT: OP clear, TM's clear, neck supple PULM: CTA B, normal percussion CV: RRR, no mgr, trace edema GI: BS+, soft, nontender Derm: no cyanosis or rash Psyche: normal mood and affect    CBC    Component Value Date/Time   WBC 10.9 (H) 01/27/2016 1521   RBC 5.50 01/27/2016 1521   HGB 14.5 01/27/2016 1521   HCT 45.0 01/27/2016 1521   PLT 423 (H) 01/27/2016 1521   MCV 81.8 01/27/2016 1521   MCH 26.4 01/27/2016 1521   MCHC 32.2 01/27/2016 1521   RDW 13.9 01/27/2016 1521   LYMPHSABS 1.0 01/13/2016 1330   MONOABS 1.0 01/13/2016 1330   EOSABS 0.0 01/13/2016 1330   BASOSABS 0.0 01/13/2016 1330        Assessment & Plan:  Cough This is due to acid reflux. I have advised that he stay on some degree of acid reflux therapy for the next 3-4 months.  Plan: Continue taking Prilosec and famotidine for one month after discontinuing prednisone Stop omeprazole in  5 weeks Continue famotidine for 3-4 months  Pulmonary sarcoidosis (HCC) Stable interval without worsening disease. At this point, the likelihood of recurrence is less than 10%. He is at a point now where prednisone is causing more harm than good. Today we talked about the fact that sarcoidosis can affect any tissue in the body including  the heart, kidneys, lungs, eyes. I advised that he talk to his ophthalmologist about his diagnosis and whenever red flag signs of severe ocular sarcoidosis.  Plan: Stopped prednisone with a quick taper over the next 5 days Chest x-ray today Follow-up with me in one year or sooner if he develops respiratory symptoms Follow-up with ophthalmology    Current Outpatient Prescriptions:  .  acetaminophen (TYLENOL) 500 MG tablet, Take 500 mg by mouth every 6 (six) hours as needed (for pain.)., Disp: , Rfl:  .  albuterol (PROVENTIL HFA;VENTOLIN HFA) 108 (90 Base) MCG/ACT inhaler, Inhale 2 puffs into the lungs every 6 (six) hours as needed for wheezing or shortness of breath., Disp: 1  Inhaler, Rfl: 6 .  famotidine (PEPCID) 20 MG tablet, TAKE 1 TABLET BY MOUTH ONCE AT BEDTIME, Disp: 30 tablet, Rfl: 2 .  omeprazole (PRILOSEC) 40 MG capsule, Take 40 mg by mouth daily. , Disp: , Rfl:  .  predniSONE (DELTASONE) 10 MG tablet, Take prednisone 10 mg daily for two days, then take 20mg  daily on day three, then resume 10mg  daily for two days, then 20mg  daily for one day.  Repeat this pattern for two weeks, then take 10mg  daily until you see [place] next, Disp: 150 tablet, Rfl: 0 .  traMADol (ULTRAM) 50 MG tablet, Take 1-2 tablets (50-100 mg total) by mouth every 6 (six) hours as needed for moderate pain or severe pain., Disp: 30 tablet, Rfl: 0  Current Facility-Administered Medications:  .  promethazine-codeine (PHENERGAN with CODEINE) 6.25-10 MG/5ML syrup 5 mL, 5 mL, Oral, Q6H PRN, Juanito Doom, MD

## 2016-05-11 NOTE — Assessment & Plan Note (Signed)
This is due to acid reflux. I have advised that he stay on some degree of acid reflux therapy for the next 3-4 months.  Plan: Continue taking Prilosec and famotidine for one month after discontinuing prednisone Stop omeprazole in  5 weeks Continue famotidine for 3-4 months

## 2016-05-11 NOTE — Patient Instructions (Signed)
In regards to the prednisone: Split the 10 mg tablets in half and take 5 mg every other day for the next week  In regard see her antacid therapy: Keep taking both medicines for one month after stopping the prednisone One month after stopping prednisone, stop the Prilosec Keep taking famotidine for the next 3-4 months  We will see you back in one year with a chest x-ray

## 2016-09-13 ENCOUNTER — Encounter: Payer: Self-pay | Admitting: Pulmonary Disease

## 2016-09-13 ENCOUNTER — Ambulatory Visit (INDEPENDENT_AMBULATORY_CARE_PROVIDER_SITE_OTHER): Payer: 59 | Admitting: Pulmonary Disease

## 2016-09-13 ENCOUNTER — Ambulatory Visit (INDEPENDENT_AMBULATORY_CARE_PROVIDER_SITE_OTHER)
Admission: RE | Admit: 2016-09-13 | Discharge: 2016-09-13 | Disposition: A | Discharge status: Home / Self Care | Payer: 59 | Source: Ambulatory Visit | Attending: Pulmonary Disease | Admitting: Pulmonary Disease

## 2016-09-13 VITALS — BP 156/84 | HR 83 | Ht 66.0 in | Wt 176.0 lb

## 2016-09-13 DIAGNOSIS — R05 Cough: Secondary | ICD-10-CM

## 2016-09-13 DIAGNOSIS — D860 Sarcoidosis of lung: Secondary | ICD-10-CM | POA: Diagnosis not present

## 2016-09-13 DIAGNOSIS — R059 Cough, unspecified: Secondary | ICD-10-CM

## 2016-09-13 DIAGNOSIS — K219 Gastro-esophageal reflux disease without esophagitis: Secondary | ICD-10-CM

## 2016-09-13 NOTE — Patient Instructions (Addendum)
For your GERD:  Resume taking Famotidine  Lose weight  Cough: If you find that the cough doesn't get better after using the famotidine let me know  We will check a CXR and call you with the results   Sarcoidosis: We will get a lung function test on the next visit  We will see you back in 6 months or sooner if needed

## 2016-09-13 NOTE — Progress Notes (Signed)
Subjective:    Patient ID: JAQUON GINGERICH, male    DOB: 1955-10-13, 61 y.o.   MRN: [id number]  Synopsis: Self-referral in 2017 for cough and was ultimately diagnosed with sarcoidosis based on a mediastinal lymph node recestion in 2017. A CT scan of the chest had been performed prior to that visit showing mediastinal lymphadenopathy. Underwent endobronchial ultrasound-guided fine-needle aspiration and BAL and endobronchial biopsy in October 2017 all of which was negative. Appears to have acid reflux. Cough improved with antacid treatment.  Ultimately a mediastinoscopy showed non-caseating granulomas in 01/2016.    He was treated with prednisone for 6-8 weeks due to a constellation of symptoms of cough and dyspnea.   HPI Chief Complaint  Patient presents with  . Follow-up    pt states his cough has returned X1 month- cough is nonprod.     Cough: > he says its coming back some > it's worse in the last month but not as severe as it was previously > characterized as intermittent coughing spells > he describes a tightness in the middle of his chest wihout pain > sometimes when he eats it feels like there is a narrowing in his throat, only intermittently though > taking too much will make him cough more  Dyspnea: > mild dyspnea when his talking > taking  Past Medical History:  Diagnosis Date  . Allergy    takes Claritin daily  . GERD (gastroesophageal reflux disease)    takes OMeprazole daily  . History of bronchitis    around 08  . History of colon polyps    benign  . Hyperlipidemia   . Pneumonia    hx of couple of yrs ago  . PONV (postoperative nausea and vomiting)       Review of Systems  Constitutional: Positive for chills and fatigue. Negative for fever.  HENT: Positive for nosebleeds, postnasal drip and rhinorrhea.   Respiratory: Positive for cough, shortness of breath and wheezing.   Cardiovascular: Negative for chest pain, palpitations and leg swelling.         Objective:   Physical Exam Vitals:   09/13/16 1327  BP: (!) 156/84  Pulse: 83  SpO2: 97%  Weight: 176 lb (79.8 kg)  Height: 5\' 6"  (1.676 m)   RA  Gen: well appearing HENT: OP clear, TM's clear, neck supple PULM: CTA B, normal percussion CV: RRR, no mgr, trace edema GI: BS+, soft, nontender Derm: no cyanosis or rash Psyche: normal mood and affect     CBC    Component Value Date/Time   WBC 10.9 (H) 01/27/2016 1521   RBC 5.50 01/27/2016 1521   HGB 14.5 01/27/2016 1521   HCT 45.0 01/27/2016 1521   PLT 423 (H) 01/27/2016 1521   MCV 81.8 01/27/2016 1521   MCH 26.4 01/27/2016 1521   MCHC 32.2 01/27/2016 1521   RDW 13.9 01/27/2016 1521   LYMPHSABS 1.0 01/13/2016 1330   MONOABS 1.0 01/13/2016 1330   EOSABS 0.0 01/13/2016 1330   BASOSABS 0.0 01/13/2016 1330        Assessment & Plan:  Cough - Plan: DG Chest 2 View  Pulmonary sarcoidosis ([place]) - Plan: Pulmonary function test  Gastroesophageal reflux disease, esophagitis presence not specified  Discussion: 61 year old male comes to my clinic today for follow-up of sarcoidosis. He is concerned about his worsening cough. He has no other signs or symptoms to suggest worsening pulmonary sarcoid such as weight loss, fevers, chills or shortness of breath. I believe that the cough  is recurrent because of ongoing acid reflux as he stopped taking his reflux medications. He is also gained weight which will contribute to the severity of his acid reflux.  Plan: For your GERD:  Resume taking Famotidine  Lose weight  Cough: If you find that the cough doesn't get better after using the famotidine let me know  We will check a CXR and call you with the results   Sarcoidosis: We will get a lung function test on the next visit  We will see you back in 6 months or sooner if needed  15 minutes spent face to face, 26 minute visit   Current Outpatient Prescriptions:  .  acetaminophen (TYLENOL) 500 MG tablet, Take 500 mg by mouth  every 6 (six) hours as needed (for pain.)., Disp: , Rfl:  .  albuterol (PROVENTIL HFA;VENTOLIN HFA) 108 (90 Base) MCG/ACT inhaler, Inhale 2 puffs into the lungs every 6 (six) hours as needed for wheezing or shortness of breath., Disp: 1 Inhaler, Rfl: 6 .  famotidine (PEPCID) 20 MG tablet, TAKE 1 TABLET BY MOUTH ONCE AT BEDTIME (Patient not taking: Reported on 09/13/2016), Disp: 30 tablet, Rfl: 2 .  omeprazole (PRILOSEC) 40 MG capsule, Take 40 mg by mouth daily. , Disp: , Rfl:   Current Facility-Administered Medications:  .  promethazine-codeine (PHENERGAN with CODEINE) 6.25-10 MG/5ML syrup 5 mL, 5 mL, Oral, Q6H PRN, Juanito Doom, MD

## 2016-09-15 ENCOUNTER — Telehealth: Payer: Self-pay | Admitting: Pulmonary Disease

## 2016-09-15 NOTE — Telephone Encounter (Signed)
Notes recorded by Juanito Doom, MD on 09/13/2016 at 5:21 PM EDT A, Please let the patient know this showed nothing new and our plans should stay the same as we discussed in clinic. Thanks, B  Spoke with patient. He is aware of results. Nothing else needed at time of call.

## 2016-09-16 DIAGNOSIS — H6121 Impacted cerumen, right ear: Secondary | ICD-10-CM | POA: Diagnosis not present

## 2016-09-27 ENCOUNTER — Telehealth: Payer: Self-pay | Admitting: Pulmonary Disease

## 2016-09-27 DIAGNOSIS — D860 Sarcoidosis of lung: Secondary | ICD-10-CM

## 2016-09-27 MED ORDER — FAMOTIDINE 20 MG PO TABS: ORAL_TABLET | ORAL | 0 refills | Status: DC

## 2016-09-27 NOTE — Telephone Encounter (Signed)
Pt calling with update since taking Famotidine - pt states that the chest tightness is gone but he is still coughing - not chronic. Pt states his coughing is dry. Pt states that his SOB is improved as well.  Pt not using anything for cough suppressant -- Rx Phenergan/Codeine not working.   Pt wants to know if he needs to continue the Famotidine event though his symptoms are mainly gone except for cough or if he can stop taking it.   Dr Lake Bells is not available, will send to Sarah for rec's.   Patient Instructions 09/13/16  For your GERD:  Resume taking Famotidine  Lose weight  Cough: If you find that the cough doesn't get better after using the famotidine let me know  We will check a CXR and call you with the results   Sarcoidosis: We will get a lung function test on the next visit  We will see you back in 6 months or sooner if needed

## 2016-09-27 NOTE — Telephone Encounter (Signed)
Pt aware, will send message to Dr Lake Bells to advise once he returns.  Pt requested 1 month Rx be sent to the pharmacy. Nothing further needed.

## 2016-09-27 NOTE — Telephone Encounter (Signed)
Have him continue taking the medication until he sees McQuaid again as it seems to be working.. Thanks so much.

## 2016-09-27 NOTE — Telephone Encounter (Signed)
Patient is returning phone call.  °

## 2016-10-04 ENCOUNTER — Ambulatory Visit (INDEPENDENT_AMBULATORY_CARE_PROVIDER_SITE_OTHER): Payer: 59 | Admitting: Orthopaedic Surgery

## 2016-10-04 ENCOUNTER — Ambulatory Visit (INDEPENDENT_AMBULATORY_CARE_PROVIDER_SITE_OTHER): Payer: 59

## 2016-10-04 DIAGNOSIS — M7541 Impingement syndrome of right shoulder: Secondary | ICD-10-CM | POA: Insufficient documentation

## 2016-10-04 DIAGNOSIS — M25511 Pain in right shoulder: Secondary | ICD-10-CM | POA: Diagnosis not present

## 2016-10-04 DIAGNOSIS — G8929 Other chronic pain: Secondary | ICD-10-CM | POA: Diagnosis not present

## 2016-10-04 NOTE — Progress Notes (Signed)
Office Visit Note   Patient: Robert Morrison           Date of Birth: 1955-11-08           MRN: [id number] Visit Date: 10/04/2016              Requested by: Shon Baton, [address], [place] [address] PCP: Shon Baton, MD   Assessment & Plan: Visit Diagnoses:  1. Chronic right shoulder pain   2. Impingement syndrome of right shoulder     Plan: Given the amount of acromioclavicular arthritic changes right shoulder he is having impingement syndrome. We talked about treatment for this. Offered him a steroid injection but is on hold off on this consisted right now he is not doing as much of her activities and things calm down. All questions were encouraged and answered. My next step would definitely be a steroid injection and he understands this. He will call if it worsens he can come in to have an injection in subacromial space of his right shoulder.  Follow-Up Instructions: Return if symptoms worsen or fail to improve.   Orders:  Orders Placed This Encounter  Procedures  . XR Shoulder Right   No orders of the defined types were placed in this encounter.     Procedures: No procedures performed   Clinical Data: No additional findings.   Subjective: No chief complaint on file. The patient is listed as a new patient because I have seen in the long period time but have actually seen him before. He has had a history of a left shoulder rotator cuff tear that did require surgery. Over the last few months his right shoulder is been hurting quite a bit. It hurts mainly reaching behind him with overhead activities as well as reaching across. Sometimes is daily pain; sometimes it is a sharp pain. It is beginning to detrimentally affected his activities daily living, his quality of life, and his mobility. He denies any neck pain. He denies a nubs and tingling going down his right arm. He is right-hand-dominant.  HPI  Review of Systems He currently denies any headache,  chest pain, short of breath, fever, chills, nausea, vomiting.  Objective: Vital Signs: There were no vitals taken for this visit.  Physical Exam He is alert and oriented 3 and in no acute distress Ortho Exam Examination of his right shoulder shows fluid range of motion that is painful. He has a negative liftoff. He has good strength with abduction and external rotation but does motions are painful to him. He does have positive Neer and Hawkins sign. He seems to use more of his rotator cuff to abduct his shoulder than his deltoids. Specialty Comments:  No specialty comments available.  Imaging: Xr Shoulder Right  Result Date: 10/04/2016 3 views of the right shoulder show well located glenohumeral joint. There is mild to moderate acromioclavicular arthritis. The humeral head is in a normal position in the glenoid. There is no acute findings otherwise. The bone quality looks good.    PMFS History: Patient Active Problem List   Diagnosis Date Noted  . Impingement syndrome of right shoulder 10/04/2016  . Pulmonary sarcoidosis ([place]) 11/28/2015  . Cough 11/12/2015   Past Medical History:  Diagnosis Date  . Allergy    takes Claritin daily  . GERD (gastroesophageal reflux disease)    takes OMeprazole daily  . History of bronchitis    around 08  . History of colon polyps    benign  .  Hyperlipidemia   . Pneumonia    hx of couple of yrs ago  . PONV (postoperative nausea and vomiting)     Family History  Problem Relation Age of Onset  . Colon polyps Sister   . Colon cancer Neg Hx     Past Surgical History:  Procedure Laterality Date  . COLONOSCOPY  12-05-2009  . COLONOSCOPY    . ELBOW FRACTURE SURGERY Left 1998  . ENDOBRONCHIAL ULTRASOUND Bilateral 12/08/2015   Procedure: ENDOBRONCHIAL ULTRASOUND;  Surgeon: Juanito Doom, MD;  Location: [facility];  Service: Cardiopulmonary;  Laterality: Bilateral;  . MEDIASTINOSCOPY N/A 01/29/2016   Procedure: MEDIASTINOSCOPY;   Surgeon: Melrose Nakayama, MD;  Location: [facility];  Service: Thoracic;  Laterality: N/A;  . POLYPECTOMY    . ROTATOR CUFF REPAIR Left 2010   Social History   Occupational History  . Not on file.   Social History Main Topics  . Smoking status: Never Smoker  . Smokeless tobacco: Never Used  . Alcohol use 0.0 oz/week     Comment: rarely  . Drug use: No  . Sexual activity: Not on file

## 2016-10-12 DIAGNOSIS — D225 Melanocytic nevi of trunk: Secondary | ICD-10-CM | POA: Diagnosis not present

## 2016-10-12 DIAGNOSIS — L821 Other seborrheic keratosis: Secondary | ICD-10-CM | POA: Diagnosis not present

## 2016-10-25 DIAGNOSIS — R7309 Other abnormal glucose: Secondary | ICD-10-CM | POA: Diagnosis not present

## 2016-10-25 DIAGNOSIS — Z0000 Encounter for general adult medical examination without abnormal findings: Secondary | ICD-10-CM | POA: Diagnosis not present

## 2016-10-25 DIAGNOSIS — Z125 Encounter for screening for malignant neoplasm of prostate: Secondary | ICD-10-CM | POA: Diagnosis not present

## 2016-11-02 DIAGNOSIS — Z125 Encounter for screening for malignant neoplasm of prostate: Secondary | ICD-10-CM | POA: Diagnosis not present

## 2016-11-02 DIAGNOSIS — R590 Localized enlarged lymph nodes: Secondary | ICD-10-CM | POA: Diagnosis not present

## 2016-11-02 DIAGNOSIS — Z0000 Encounter for general adult medical examination without abnormal findings: Secondary | ICD-10-CM | POA: Diagnosis not present

## 2016-11-02 DIAGNOSIS — Z1389 Encounter for screening for other disorder: Secondary | ICD-10-CM | POA: Diagnosis not present

## 2016-11-02 DIAGNOSIS — D869 Sarcoidosis, unspecified: Secondary | ICD-10-CM | POA: Diagnosis not present

## 2016-11-02 DIAGNOSIS — R05 Cough: Secondary | ICD-10-CM | POA: Diagnosis not present

## 2016-11-11 ENCOUNTER — Telehealth: Payer: Self-pay | Admitting: Pulmonary Disease

## 2016-11-11 DIAGNOSIS — D860 Sarcoidosis of lung: Secondary | ICD-10-CM

## 2016-11-11 MED ORDER — FAMOTIDINE 20 MG PO TABS: ORAL_TABLET | ORAL | 5 refills | Status: DC

## 2016-11-11 NOTE — Telephone Encounter (Signed)
Called pt to advise Rx for pepcid sent to his pharmacy. Nothing further is needed.

## 2017-01-05 ENCOUNTER — Ambulatory Visit (INDEPENDENT_AMBULATORY_CARE_PROVIDER_SITE_OTHER): Payer: 59 | Admitting: Orthopaedic Surgery

## 2017-01-10 ENCOUNTER — Ambulatory Visit (INDEPENDENT_AMBULATORY_CARE_PROVIDER_SITE_OTHER): Payer: 59 | Admitting: Physician Assistant

## 2017-01-10 ENCOUNTER — Encounter (INDEPENDENT_AMBULATORY_CARE_PROVIDER_SITE_OTHER): Payer: Self-pay | Admitting: Orthopaedic Surgery

## 2017-01-10 DIAGNOSIS — M7541 Impingement syndrome of right shoulder: Secondary | ICD-10-CM

## 2017-01-10 MED ORDER — METHYLPREDNISOLONE ACETATE 40 MG/ML IJ SUSP
40.0000 mg | INTRAMUSCULAR | Status: AC | PRN
  Administered 2017-01-10: 40 mg via INTRA_ARTICULAR

## 2017-01-10 MED ORDER — LIDOCAINE HCL 1 % IJ SOLN
3.0000 mL | INTRAMUSCULAR | Status: AC | PRN
  Administered 2017-01-10: 3 mL

## 2017-01-10 NOTE — Progress Notes (Signed)
   Procedure Note  Patient: Robert Morrison             Date of Birth: 07-Oct-1955           MRN: [id number]             Visit Date: 01/10/2017 HPI: Robert Morrison swelling on Dr. Ninfa Linden service was last seen on 10/04/2016 and diagnosed with right shoulder impingement.  He was offered a cortisone injection in the shoulder at that time.  He deferred.  Comes back in today due to decrease motion of the right arm and increasing pain.  He is having difficulty sleeping due to the shoulder pain.  He is requesting an injection in the right shoulder.  He denies any radicular symptoms down the right arm.  Procedures: Visit Diagnoses: Impingement syndrome of right shoulder  Large Joint Inj: R subacromial bursa on 01/10/2017 6:13 PM Indications: pain Details: 22 G 1.5 in needle, superior approach  Arthrogram: No  Medications: 3 mL lidocaine 1 %; 40 mg methylPREDNISolone acetate 40 MG/ML Outcome: tolerated well, no immediate complications Procedure, treatment alternatives, risks and benefits explained, specific risks discussed. Consent was given by the patient. Immediately prior to procedure a time out was called to verify the correct patient, procedure, equipment, support staff and site/side marked as required. Patient was prepped and draped in the usual sterile fashion.     Plan: I am shown him pendulum, Codman, wall crawls and forward flexion exercises.  He will follow-up with [place] in 2 weeks check his progress lack of.  Questions were encouraged and answered at length.

## 2017-01-24 ENCOUNTER — Ambulatory Visit (INDEPENDENT_AMBULATORY_CARE_PROVIDER_SITE_OTHER): Payer: 59 | Admitting: Orthopaedic Surgery

## 2017-02-01 ENCOUNTER — Encounter (INDEPENDENT_AMBULATORY_CARE_PROVIDER_SITE_OTHER): Payer: Self-pay | Admitting: Orthopaedic Surgery

## 2017-02-01 ENCOUNTER — Ambulatory Visit (INDEPENDENT_AMBULATORY_CARE_PROVIDER_SITE_OTHER): Payer: 59 | Admitting: Orthopaedic Surgery

## 2017-02-01 DIAGNOSIS — M7541 Impingement syndrome of right shoulder: Secondary | ICD-10-CM

## 2017-02-01 MED ORDER — METHYLPREDNISOLONE 4 MG PO TABS: ORAL_TABLET | ORAL | 0 refills | Status: DC

## 2017-02-01 NOTE — Progress Notes (Signed)
The patient is about 3 weeks status post a right shoulder subacromial injection with a steroid to treat shoulder impingement syndrome.  He said the injection helped some but is not where he wants to be at.  However he says is not as bad pain is had before.  He says it is still painful to reach up or push himself up but he can put on a jacket better and reach behind him better.  He still not interested in any physical therapy.  He is answering surgery at either.  On exam there is some weakness with external rotation of his right shoulder.  He does reach behind him to the lower lumbar spine which is actually improved from what he was before.  He does not use his deltoid to abduct his shoulder.  His range of motion is full otherwise except for the internal rotation with adduction which is limited.  A long and thorough discussion about shoulder impingement syndrome.  He would like to try at least a 6-day steroid taper and in 6 weeks another steroid injection in the subacromial space before considering other treatment options.  He says he does not have time to go therapy but he is improving in terms of his motion.  I have also told him to try to work on his sleep habits of not sleeping with her arm above his head.

## 2017-02-15 HISTORY — PX: ROTATOR CUFF REPAIR: SHX139

## 2017-03-11 ENCOUNTER — Ambulatory Visit (INDEPENDENT_AMBULATORY_CARE_PROVIDER_SITE_OTHER): Payer: 59 | Admitting: Pulmonary Disease

## 2017-03-11 DIAGNOSIS — D860 Sarcoidosis of lung: Secondary | ICD-10-CM | POA: Diagnosis not present

## 2017-03-11 LAB — PULMONARY FUNCTION TEST
DL/VA % pred: 129 %
DL/VA: 5.6 ml/min/mmHg/L
DLCO COR % PRED: 104 %
DLCO UNC: 27.28 ml/min/mmHg
DLCO cor: 27.44 ml/min/mmHg
DLCO unc % pred: 103 %
FEF 25-75 POST: 3.59 L/s
FEF 25-75 Pre: 4.24 L/sec
FEF2575-%Change-Post: -15 %
FEF2575-%PRED-POST: 148 %
FEF2575-%Pred-Pre: 175 %
FEV1-%Change-Post: 0 %
FEV1-%PRED-POST: 99 %
FEV1-%Pred-Pre: 100 %
FEV1-Post: 2.92 L
FEV1-Pre: 2.94 L
FEV1FVC-%Change-Post: 1 %
FEV1FVC-%PRED-PRE: 117 %
FEV6-%Change-Post: -1 %
FEV6-%Pred-Post: 87 %
FEV6-%Pred-Pre: 89 %
FEV6-POST: 3.27 L
FEV6-Pre: 3.33 L
FEV6FVC-%PRED-POST: 105 %
FEV6FVC-%Pred-Pre: 105 %
FVC-%CHANGE-POST: -1 %
FVC-%PRED-PRE: 84 %
FVC-%Pred-Post: 83 %
FVC-POST: 3.27 L
FVC-PRE: 3.33 L
POST FEV1/FVC RATIO: 89 %
PRE FEV1/FVC RATIO: 88 %
Post FEV6/FVC ratio: 100 %
Pre FEV6/FVC Ratio: 100 %
RV % pred: 68 %
RV: 1.42 L
TLC % pred: 79 %
TLC: 4.83 L

## 2017-03-11 NOTE — Progress Notes (Signed)
PFT completed today 03/11/17

## 2017-03-15 ENCOUNTER — Ambulatory Visit (INDEPENDENT_AMBULATORY_CARE_PROVIDER_SITE_OTHER): Payer: 59 | Admitting: Orthopaedic Surgery

## 2017-03-15 ENCOUNTER — Encounter (INDEPENDENT_AMBULATORY_CARE_PROVIDER_SITE_OTHER): Payer: Self-pay | Admitting: Orthopaedic Surgery

## 2017-03-15 DIAGNOSIS — M7541 Impingement syndrome of right shoulder: Secondary | ICD-10-CM

## 2017-03-15 MED ORDER — LIDOCAINE HCL 1 % IJ SOLN
3.0000 mL | INTRAMUSCULAR | Status: AC | PRN
  Administered 2017-03-15: 3 mL

## 2017-03-15 MED ORDER — METHYLPREDNISOLONE ACETATE 40 MG/ML IJ SUSP
40.0000 mg | INTRAMUSCULAR | Status: AC | PRN
  Administered 2017-03-15: 40 mg via INTRA_ARTICULAR

## 2017-03-15 NOTE — Progress Notes (Signed)
Office Visit Note   Patient: Robert Morrison           Date of Birth: 05/04/1955           MRN: [id number] Visit Date: 03/15/2017              Requested by: Shon Baton, [address], [place] [address] PCP: Shon Baton, MD   Assessment & Plan: Visit Diagnoses:  1. Impingement syndrome of right shoulder     Plan: I counseled him extensively about the risk and benefits of steroid injections.  He did wish to have this again today and I provided the subacromial space in the right shoulder without difficulty.  He knows to wait until least May or June of this year to consider repeat injection to give a longer time between injections.  The other option would be arthroscopic intervention which have explained to him as well.  Follow-Up Instructions: Return if symptoms worsen or fail to improve.   Orders:  Orders Placed This Encounter  Procedures  . Large Joint Inj   No orders of the defined types were placed in this encounter.     Procedures: Large Joint Inj: R subacromial bursa on 03/15/2017 11:16 AM Indications: pain and diagnostic evaluation Details: 22 G 1.5 in needle  Arthrogram: No  Medications: 3 mL lidocaine 1 %; 40 mg methylPREDNISolone acetate 40 MG/ML Outcome: tolerated well, no immediate complications Procedure, treatment alternatives, risks and benefits explained, specific risks discussed. Consent was given by the patient. Immediately prior to procedure a time out was called to verify the correct patient, procedure, equipment, support staff and site/side marked as required. Patient was prepped and draped in the usual sterile fashion.       Clinical Data: No additional findings.   Subjective: Chief Complaint  Patient presents with  . Right Shoulder - Pain, Follow-up  The patient is well-known to [place].  He is coming in today requesting a subacromial steroid injection in his right shoulder like is had in the past.  He is now diabetic and he is  actually retiring next month.  He is very active 62 year old.  It hurts mainly with overhead activities and when he sleeps at night.  We diagnosed him with significant impingement syndrome.  He knows it is a little bit early to have a steroid injection but I am finally doing this today knowing that he needs to wait until least May or June of this year to consider another injection.  I do think with him retiring this will help take some ease off of the shoulder but also I do feel that the next step would be a right shoulder arthroscopy with subacromial decompression if it continues to give him significant problems.  It does wake him up at night still.  HPI  Review of Systems He currently denies any headache, chest pain, shortness of breath, fever, chills, nausea, vomiting.   Objective: Vital Signs: There were no vitals taken for this visit.  Physical Exam He is alert and oriented x3 and in no acute distress Ortho Exam Examination of his right shoulder continues to show positive Neer and Hawkins signs with full range of motion and excellent strength of the rotator cuff. Specialty Comments:  No specialty comments available.  Imaging: No results found.   PMFS History: Patient Active Problem List   Diagnosis Date Noted  . Impingement syndrome of right shoulder 10/04/2016  . Pulmonary sarcoidosis ([place]) 11/28/2015  . Cough 11/12/2015   Past  Medical History:  Diagnosis Date  . Allergy    takes Claritin daily  . GERD (gastroesophageal reflux disease)    takes OMeprazole daily  . History of bronchitis    around 08  . History of colon polyps    benign  . Hyperlipidemia   . Pneumonia    hx of couple of yrs ago  . PONV (postoperative nausea and vomiting)     Family History  Problem Relation Age of Onset  . Colon polyps Sister   . Colon cancer Neg Hx     Past Surgical History:  Procedure Laterality Date  . COLONOSCOPY  12-05-2009  . COLONOSCOPY    . ELBOW FRACTURE SURGERY Left  1998  . ENDOBRONCHIAL ULTRASOUND Bilateral 12/08/2015   Procedure: ENDOBRONCHIAL ULTRASOUND;  Surgeon: Juanito Doom, MD;  Location: [facility];  Service: Cardiopulmonary;  Laterality: Bilateral;  . MEDIASTINOSCOPY N/A 01/29/2016   Procedure: MEDIASTINOSCOPY;  Surgeon: Melrose Nakayama, MD;  Location: [facility];  Service: Thoracic;  Laterality: N/A;  . POLYPECTOMY    . ROTATOR CUFF REPAIR Left 2010   Social History   Occupational History  . Not on file  Tobacco Use  . Smoking status: Never Smoker  . Smokeless tobacco: Never Used  Substance and Sexual Activity  . Alcohol use: Yes    Alcohol/week: 0.0 oz    Comment: rarely  . Drug use: No  . Sexual activity: Not on file

## 2017-03-31 ENCOUNTER — Encounter: Payer: Self-pay | Admitting: Pulmonary Disease

## 2017-03-31 ENCOUNTER — Ambulatory Visit (INDEPENDENT_AMBULATORY_CARE_PROVIDER_SITE_OTHER): Payer: 59 | Admitting: Pulmonary Disease

## 2017-03-31 VITALS — BP 148/86 | HR 82 | Ht 66.0 in | Wt 171.0 lb

## 2017-03-31 DIAGNOSIS — K219 Gastro-esophageal reflux disease without esophagitis: Secondary | ICD-10-CM

## 2017-03-31 DIAGNOSIS — R059 Cough, unspecified: Secondary | ICD-10-CM

## 2017-03-31 DIAGNOSIS — R05 Cough: Secondary | ICD-10-CM | POA: Diagnosis not present

## 2017-03-31 DIAGNOSIS — D860 Sarcoidosis of lung: Secondary | ICD-10-CM | POA: Diagnosis not present

## 2017-03-31 NOTE — Progress Notes (Signed)
Subjective:    Patient ID: Robert Morrison, male    DOB: [birth year], 62 y.o.   MRN: [id number]  Synopsis: Self-referral in 2017 for cough and was ultimately diagnosed with sarcoidosis based on a mediastinal lymph node recestion in 2017. A CT scan of the chest had been performed prior to that visit showing mediastinal lymphadenopathy. Underwent endobronchial ultrasound-guided fine-needle aspiration and BAL and endobronchial biopsy in October 2017 all of which was negative. Appears to have acid reflux. Cough improved with antacid treatment.  Ultimately a mediastinoscopy showed non-caseating granulomas in 01/2016.    He was treated with prednisone for 6-8 weeks due to a constellation of symptoms of cough and dyspnea.   HPI Chief Complaint  Patient presents with  . Follow-up    review PFT.  pt denies any current breathing complaints.    Robert Morrison is not coughing right now.  He feels fine. He says that he is not having trouble breathing. He is still taking generic prescribed famotidine but he wonders if it is making him itch more.  He is having a lot more itching in his lower legs and thighs.  He says that two weeks ago the itch was fairly severe.  He stopped taking the famotidine and the itching went away.  He says that he started taking Pntac and he can tolerate this. He said that he had some chest pressure years ago and the famotidine helped.    Past Medical History:  Diagnosis Date  . Allergy    takes Claritin daily  . GERD (gastroesophageal reflux disease)    takes OMeprazole daily  . History of bronchitis    around 08  . History of colon polyps    benign  . Hyperlipidemia   . Pneumonia    hx of couple of yrs ago  . PONV (postoperative nausea and vomiting)       Review of Systems  Constitutional: Positive for chills and fatigue. Negative for fever.  HENT: Positive for nosebleeds, postnasal drip and rhinorrhea.   Respiratory: Positive for cough, shortness of breath and wheezing.    Cardiovascular: Negative for chest pain, palpitations and leg swelling.       Objective:   Physical Exam Vitals:   03/31/17 1446  BP: (!) 148/86  Pulse: 82  SpO2: 97%  Weight: 171 lb (77.6 kg)  Height: 5\' 6"  (1.676 m)   RA  Gen: well appearing HENT: OP clear, TM's clear, neck supple PULM: CTA B, normal percussion CV: RRR, no mgr, trace edema GI: BS+, soft, nontender Derm: no cyanosis or rash Psyche: normal mood and affect     CBC    Component Value Date/Time   WBC 10.9 (H) 01/27/2016 1521   RBC 5.50 01/27/2016 1521   HGB 14.5 01/27/2016 1521   HCT 45.0 01/27/2016 1521   PLT 423 (H) 01/27/2016 1521   MCV 81.8 01/27/2016 1521   MCH 26.4 01/27/2016 1521   MCHC 32.2 01/27/2016 1521   RDW 13.9 01/27/2016 1521   LYMPHSABS 1.0 01/13/2016 1330   MONOABS 1.0 01/13/2016 1330   EOSABS 0.0 01/13/2016 1330   BASOSABS 0.0 01/13/2016 1330   Pulmonary function test: January 2019 ratio normal, FVC 3.3 L 84% predicted, total lung capacity 4.83 L 79% predicted, DLCO 27.3 mL 103%  Pathology: December 2017 4R lymph node from fine-needle aspiration showing granulomatous inflammation     Assessment & Plan:  Pulmonary sarcoidosis (Robert Morrison) - Plan: Pulmonary function test  Gastroesophageal reflux disease, esophagitis presence not specified  Cough  Discussion: This has been a stable interval for [name].  He has no evidence of recurrent sarcoidosis.  He has a cough from time to time which is due to gastroesophageal reflux disease.  However, when he has been coughing recently we got a lung function test that showed no evidence of active sarcoidosis.  It sounds as if he is allergic to the generic form of prescribed famotidine.  Over-the-counter Pepcid (famotidine) does not give him trouble with itching.  Plan: Gastroesophageal reflux disease: Resume Pepcid if you have chest tightness or cough again consistent with your prior gastroesophageal reflux disease Otherwise, I think  it is reasonable to stay off of Pepcid for now  Sarcoidosis: The lung function test last month was improved compared to prior, showing no evidence of active sarcoidosis. We will continue to watch out for symptoms with annual lung function testing, the next will be when we see you in January 2020  Current Outpatient Medications:  .  acetaminophen (TYLENOL) 500 MG tablet, Take 500 mg by mouth every 6 (six) hours as needed (for pain.)., Disp: , Rfl:  .  albuterol (PROVENTIL HFA;VENTOLIN HFA) 108 (90 Base) MCG/ACT inhaler, Inhale 2 puffs into the lungs every 6 (six) hours as needed for wheezing or shortness of breath., Disp: 1 Inhaler, Rfl: 6 .  famotidine (PEPCID) 20 MG tablet, TAKE 1 TABLET BY MOUTH ONCE AT BEDTIME, Disp: 30 tablet, Rfl: 5 .  omeprazole (PRILOSEC) 40 MG capsule, Take 40 mg by mouth daily. , Disp: , Rfl:   Current Facility-Administered Medications:  .  promethazine-codeine (PHENERGAN with CODEINE) 6.25-10 MG/5ML syrup 5 mL, 5 mL, Oral, Q6H PRN, Juanito Doom, MD

## 2017-03-31 NOTE — Patient Instructions (Signed)
Gastroesophageal reflux disease: Resume Pepcid if you have chest tightness or cough again consistent with your prior gastroesophageal reflux disease Otherwise, I think it is reasonable to stay off of Pepcid for now  Sarcoidosis: The lung function test last month was improved compared to prior, showing no evidence of active sarcoidosis. We will continue to watch out for symptoms with annual lung function testing, the next will be when we see you in January 2020

## 2017-05-06 DIAGNOSIS — R05 Cough: Secondary | ICD-10-CM | POA: Diagnosis not present

## 2017-05-06 DIAGNOSIS — K219 Gastro-esophageal reflux disease without esophagitis: Secondary | ICD-10-CM | POA: Diagnosis not present

## 2017-05-06 DIAGNOSIS — D869 Sarcoidosis, unspecified: Secondary | ICD-10-CM | POA: Diagnosis not present

## 2017-06-09 ENCOUNTER — Ambulatory Visit (INDEPENDENT_AMBULATORY_CARE_PROVIDER_SITE_OTHER): Payer: 59 | Admitting: Orthopaedic Surgery

## 2017-06-09 ENCOUNTER — Encounter (INDEPENDENT_AMBULATORY_CARE_PROVIDER_SITE_OTHER): Payer: Self-pay | Admitting: Orthopaedic Surgery

## 2017-06-09 DIAGNOSIS — M7541 Impingement syndrome of right shoulder: Secondary | ICD-10-CM | POA: Diagnosis not present

## 2017-06-09 NOTE — Progress Notes (Signed)
Office Visit Note   Patient: Robert Morrison           Date of Birth: 09-28-1955           MRN: [id number] Visit Date: 06/09/2017              Requested by: Shon Baton, [place], [place] [address] PCP: Shon Baton, MD   Assessment & Plan: Visit Diagnoses:  1. Impingement syndrome of right shoulder     Plan: At this point given the failure of all forms of conservative treatment with the right shoulder he would like to proceed with an arthroscopic intervention and I agree with this as well.  He had such a successful outcome of his left shoulder but this seems appropriate at this point his right shoulder.  A long and thorough discussion about the shoulder anatomy and the risk and benefits of surgery in detail he understands what this involved having had an arthroscopic intervention of his left shoulder 4.  Risk benefits were discussed in detail.  Again I do feel is necessary at this point given his decreased function of his right shoulder and the failure of conservative treatment.  All questions concerns were answered and addressed.  We will see her back on week postoperative.  Follow-Up Instructions: Return for 1 week post-op.   Orders:  No orders of the defined types were placed in this encounter.  No orders of the defined types were placed in this encounter.     Procedures: No procedures performed   Clinical Data: No additional findings.   Subjective: Chief Complaint  Patient presents with  . Right Shoulder - Pain  The patient is well-known to me.  I been seeing him for some time for his right and left shoulders.  Both shoulders have severe impingement syndrome.  We finally performed arthroscopic intervention remotely on the left shoulder and he did great with that.  His right shoulder pain is now worsening as is his range of motion with his right shoulder.  We have tried multiple interventions with injections in the right shoulder with steroid.  He is been  on anti-inflammatories and trying activity modification as well as aggressive therapy with range of motion of the shoulder.  He feels like things are at a plateau and are not getting any better.  He still has daily pain mainly activity related pain with decreased motion of his right shoulder.  He denies any neck pain.  Denies any numbness and tingling in his right hand.  HPI  Review of Systems He currently denies any headache, chest pain, shortness of breath, fever, chills, nausea, vomiting.  Objective: Vital Signs: There were no vitals taken for this visit.  Physical Exam He is alert and oriented x3 in no acute distress Ortho Exam Examination of his right shoulder shows that he is able to abduct his shoulder appropriately with an intact rotator cuff but he lacks full abduction by about 5 degrees.  He lacks full external rotation by about 5 degrees.  His internal rotation with adduction is only to the lower lumbar spine where it is full on his left side.  He has pain along the subacromial outlet.  He has positive Neer and Hawkins signs.  Again his rotator cuff is strong and he is negative liftoff. Specialty Comments:  No specialty comments available.  Imaging: No results found.   PMFS History: Patient Active Problem List   Diagnosis Date Noted  . Impingement syndrome of right  shoulder 10/04/2016  . Pulmonary sarcoidosis ([place]) 11/28/2015  . Cough 11/12/2015   Past Medical History:  Diagnosis Date  . Allergy    takes Claritin daily  . GERD (gastroesophageal reflux disease)    takes OMeprazole daily  . History of bronchitis    around 08  . History of colon polyps    benign  . Hyperlipidemia   . Pneumonia    hx of couple of yrs ago  . PONV (postoperative nausea and vomiting)     Family History  Problem Relation Age of Onset  . Colon polyps Sister   . Colon cancer Neg Hx     Past Surgical History:  Procedure Laterality Date  . COLONOSCOPY  12-05-2009  . COLONOSCOPY    .  ELBOW FRACTURE SURGERY Left 1998  . ENDOBRONCHIAL ULTRASOUND Bilateral 12/08/2015   Procedure: ENDOBRONCHIAL ULTRASOUND;  Surgeon: Juanito Doom, MD;  Location: [facility];  Service: Cardiopulmonary;  Laterality: Bilateral;  . MEDIASTINOSCOPY N/A 01/29/2016   Procedure: MEDIASTINOSCOPY;  Surgeon: Melrose Nakayama, MD;  Location: [facility];  Service: Thoracic;  Laterality: N/A;  . POLYPECTOMY    . ROTATOR CUFF REPAIR Left 2010   Social History   Occupational History  . Not on file  Tobacco Use  . Smoking status: Never Smoker  . Smokeless tobacco: Never Used  Substance and Sexual Activity  . Alcohol use: Yes    Alcohol/week: 0.0 oz    Comment: rarely  . Drug use: No  . Sexual activity: Not on file

## 2017-06-23 ENCOUNTER — Encounter: Payer: Self-pay | Admitting: Orthopaedic Surgery

## 2017-06-23 DIAGNOSIS — M75101 Unspecified rotator cuff tear or rupture of right shoulder, not specified as traumatic: Secondary | ICD-10-CM | POA: Diagnosis not present

## 2017-06-23 DIAGNOSIS — M7541 Impingement syndrome of right shoulder: Secondary | ICD-10-CM | POA: Diagnosis not present

## 2017-06-23 DIAGNOSIS — G8918 Other acute postprocedural pain: Secondary | ICD-10-CM | POA: Diagnosis not present

## 2017-06-23 DIAGNOSIS — M65811 Other synovitis and tenosynovitis, right shoulder: Secondary | ICD-10-CM | POA: Diagnosis not present

## 2017-06-23 DIAGNOSIS — M7551 Bursitis of right shoulder: Secondary | ICD-10-CM | POA: Diagnosis not present

## 2017-07-04 ENCOUNTER — Encounter (INDEPENDENT_AMBULATORY_CARE_PROVIDER_SITE_OTHER): Payer: Self-pay | Admitting: Orthopaedic Surgery

## 2017-07-04 ENCOUNTER — Ambulatory Visit (INDEPENDENT_AMBULATORY_CARE_PROVIDER_SITE_OTHER): Payer: 59 | Admitting: Orthopaedic Surgery

## 2017-07-04 DIAGNOSIS — M7541 Impingement syndrome of right shoulder: Secondary | ICD-10-CM

## 2017-07-04 DIAGNOSIS — Z9889 Other specified postprocedural states: Secondary | ICD-10-CM

## 2017-07-04 NOTE — Progress Notes (Signed)
The patient is 11 days status post a right shoulder arthroscopy with extensive debridement and subacromial decompression.  He is having limited mobility right now with his shoulder and increased pain with decreased motion.  On exam his motion is significantly limited.  His incision looks good and Steri-Strips have been removed.  I do feel there is a risk of him developing frozen shoulder.  We went over his arthroscopy pictures to show the extent of the debridement and subacromial decompression we performed.  His rotator cuff was entirely intact.  At this point he will benefit from outpatient physical therapy as well as anti-inflammatories and I counseled him out of take anti-inflammatories.  We will see him back in 2 weeks to consider steroid injection if he is not making progress.  All questions concerns were answered and addressed.

## 2017-07-05 ENCOUNTER — Other Ambulatory Visit (INDEPENDENT_AMBULATORY_CARE_PROVIDER_SITE_OTHER): Payer: Self-pay

## 2017-07-05 DIAGNOSIS — M25511 Pain in right shoulder: Principal | ICD-10-CM

## 2017-07-05 DIAGNOSIS — G8929 Other chronic pain: Secondary | ICD-10-CM

## 2017-07-07 ENCOUNTER — Other Ambulatory Visit: Payer: Self-pay

## 2017-07-07 ENCOUNTER — Ambulatory Visit: Payer: 59 | Attending: Orthopaedic Surgery | Admitting: Physical Therapy

## 2017-07-07 ENCOUNTER — Encounter: Payer: Self-pay | Admitting: Physical Therapy

## 2017-07-07 DIAGNOSIS — R293 Abnormal posture: Secondary | ICD-10-CM | POA: Insufficient documentation

## 2017-07-07 DIAGNOSIS — M6281 Muscle weakness (generalized): Secondary | ICD-10-CM

## 2017-07-07 DIAGNOSIS — M25611 Stiffness of right shoulder, not elsewhere classified: Secondary | ICD-10-CM | POA: Diagnosis not present

## 2017-07-07 DIAGNOSIS — G8929 Other chronic pain: Secondary | ICD-10-CM

## 2017-07-07 DIAGNOSIS — M25511 Pain in right shoulder: Secondary | ICD-10-CM | POA: Diagnosis present

## 2017-07-07 NOTE — Therapy (Signed)
[place], [place], [address] Phone: [phone]   Fax:  [phone]  Physical Therapy Evaluation  Patient Details  Name: Robert Morrison MRN: [id number] Date of Birth: Jul 09, 1955 Referring Provider: Jean Rosenthal    Encounter Date: 07/07/2017  PT End of Session - 07/07/17 1241    Visit Number  1    Number of Visits  17    Date for PT Re-Evaluation  08/04/17    Authorization Type  [employer]     Authorization Time Period  07/07/17 to 09/06/17    PT Start Time  1018    PT Stop Time  1057    PT Time Calculation (min)  39 min    Activity Tolerance  Patient tolerated treatment well    Behavior During Therapy  [facility] for tasks assessed/performed       Past Medical History:  Diagnosis Date  . Allergy    takes Claritin daily  . GERD (gastroesophageal reflux disease)    takes OMeprazole daily  . History of bronchitis    around 08  . History of colon polyps    benign  . Hyperlipidemia   . Pneumonia    hx of couple of yrs ago  . PONV (postoperative nausea and vomiting)     Past Surgical History:  Procedure Laterality Date  . COLONOSCOPY  12-05-2009  . COLONOSCOPY    . ELBOW FRACTURE SURGERY Left 1998  . ENDOBRONCHIAL ULTRASOUND Bilateral 12/08/2015   Procedure: ENDOBRONCHIAL ULTRASOUND;  Surgeon: Juanito Doom, MD;  Location: [facility];  Service: Cardiopulmonary;  Laterality: Bilateral;  . MEDIASTINOSCOPY N/A 01/29/2016   Procedure: MEDIASTINOSCOPY;  Surgeon: Melrose Nakayama, MD;  Location: [facility];  Service: Thoracic;  Laterality: N/A;  . POLYPECTOMY    . ROTATOR CUFF REPAIR Left 2010    There were no vitals filed for this visit.   Subjective Assessment - 07/07/17 1020    Subjective  I had shoulder surgery on the 9th of this month, about 2 weeks ago. There was a little bit of a mix-up, I ended up accidentally not taking the anti-inflammatory at first; I saw the doctor and  he made sure I started the anti-inflammatory, it is feeling better now. My shoulder is not really that painful, but it is still stiff. I cannot sleep in the bed, I've been sleeping in the recliner.     How long can you sit comfortably?  no limits    How long can you stand comfortably?  no limits    How long can you walk comfortably?  no limits     Patient Stated Goals  get my right shoulder as good as my left shoulder     Currently in Pain?  Yes    Pain Score  3     Pain Location  Shoulder    Pain Orientation  Right    Pain Descriptors / Indicators  Cramping;Tightness;Sore    Pain Type  Surgical pain    Pain Radiating Towards  none     Pain Onset  1 to 4 weeks ago    Pain Frequency  Intermittent    Aggravating Factors   reaching with arm, quick movements     Pain Relieving Factors  anti-inflammatories     Effect of Pain on Daily Activities  moderate          [facility] PT Assessment - 07/07/17 0001      Assessment   Medical Diagnosis  s/p L shoulder arthoscopic decompressiion     Referring Provider  Jean Rosenthal     Onset Date/Surgical Date  06/23/17    Next MD Visit  Dr. Ninfa Linden in 2 weeks     Prior Therapy  had PT for L shoulder surgery years ago       Precautions   Precautions  Shoulder    Precaution Comments  follow [facility] protocol       Restrictions   Weight Bearing Restrictions  No      Balance Screen   Has the patient fallen in the past 6 months  No    Has the patient had a decrease in activity level because of a fear of falling?   No    Is the patient reluctant to leave their home because of a fear of falling?   No      Prior Function   Level of Independence  Independent;Independent with basic ADLs;Independent with transfers;Independent with gait    Vocation  Retired    Leisure  no real hobbies, looking for some       ROM / Strength   AROM / PROM / Strength  AROM;PROM;Strength      AROM   AROM Assessment Site  Shoulder    Right/Left  Shoulder  Left;Right    Right Shoulder Flexion  90 Degrees    Right Shoulder ABduction  45 Degrees    Right Shoulder Internal Rotation  -- DNT AROM     Right Shoulder External Rotation  -- DNT AROM due to pain       PROM   PROM Assessment Site  Shoulder    Right/Left Shoulder  Left;Right    Right Shoulder Flexion  90 Degrees    Right Shoulder ABduction  80 Degrees    Right Shoulder Internal Rotation  80 Degrees    Right Shoulder External Rotation  40 Degrees at approxomiately 40 degrees ABD       Strength   Strength Assessment Site  Shoulder;Elbow    Right/Left Shoulder  Left;Right    Right Shoulder Flexion  3-/5 in available ROM     Right Shoulder ABduction  4/5 in available ROM     Right Shoulder Internal Rotation  5/5    Right Shoulder External Rotation  3/5 painful     Right/Left Elbow  Right;Left      Palpation   Palpation comment  TTP middle deltoid, triceps, muscles of rotator cuff R shoulder                 Objective measurements completed on examination: See above findings.      [facility] Adult PT Treatment/Exercise - 07/07/17 0001      Exercises   Exercises  Shoulder      Shoulder Exercises: Supine   Flexion  Right;AAROM;10 reps cues for form     ABduction  AAROM;Right;10 reps;Other (comment) cues for form       Shoulder Exercises: Standing   External Rotation  PROM;Right;10 reps;Other (comment) towel under elbow, cues for form              PT Education - 07/07/17 1240    Education provided  Yes    Education Details  exam findings, prognosis, POC, HEP and cues for performance, presence of compensation patterns and importance of eliminating for healthy shoulder mechanics     Person(s) Educated  Patient    Methods  Explanation;Demonstration;Handout    Comprehension  Verbalized understanding;Returned demonstration;Need further  instruction       PT Short Term Goals - 07/07/17 1245      PT SHORT TERM GOAL #1   Title  Patient to demonstrate R  shoulder AAROM and AROM as being full and pain free in order to improve gross shoulder mobility and mechanics     Time  4    Period  Weeks    Status  New    Target Date  08/04/17      PT SHORT TERM GOAL #2   Title  Patient to demonstrate resolutation of postural deficits in order to support healthy shoulder mechanics and reduce compensation patterns     Time  4    Period  Weeks    Status  New      PT SHORT TERM GOAL #3   Title  Patient to report pain as being 0/10 in R shoulder with all functional tasks and activities in order to improve QOL and functional task tolerance     Time  4    Period  Weeks    Status  New      PT SHORT TERM GOAL #4   Title  Patient to be independent in correct performance of appropriate HEP, to be updated PRN     Time  1    Period  Weeks    Status  New    Target Date  07/14/17        PT Long Term Goals - 07/07/17 1247      PT LONG TERM GOAL #1   Title  Patient to demonstrate functional strength of 5/5 in all tested muscles in order to show improved strength and stability of R shoulder     Time  8    Period  Weeks    Status  New    Target Date  09/01/17      PT LONG TERM GOAL #2   Title  Patient to be able to perform functional overhead activities without compensation patterns or scapular winging in order to ensure good shoulder mechanics     Time  8    Period  Weeks    Status  New      PT LONG TERM GOAL #3   Title  Patient to be able to perform all self-care activities such as dressing/bathing without pain or ROM limitations in order to improve QOL     Time  8    Period  Weeks    Status  New      PT LONG TERM GOAL #4   Title  Patient to be able to perform all functional daily tasks and activities without pain in order to improve QOL and demonstrate restored shoulder function     Time  8    Period  Weeks    Status  New             Plan - 07/07/17 1242    Clinical Impression Statement  Patient arrives approximately 2 weeks  following R shoulder SAD procedure with severe R shoulder stiffness and ongoing R shoulder pain especially in general region of deltoids and tricep musculature. Examination reveals poor scapulo-thoracic mechanics with severe gross compensation patterns, significant R shoulder stiffness, R shoulder weakness, and mild postural limitations. He will benefit from skilled PT services to address functional limitations, reduce pain, and assist in return to optimal level of function moving forward.     History and Personal Factors relevant to plan of care:  R shoulder SAD done  06/23/17    Clinical Presentation  Stable    Clinical Presentation due to:  post-op state, gross compensation pattern     Clinical Decision Making  Low    Rehab Potential  Good    Clinical Impairments Affecting Rehab Potential  (+) appears motivated to participate, success with PT in the past; (-) poor awareness of safety and compensations present     PT Frequency  2x / week    PT Duration  8 weeks    PT Treatment/Interventions  ADLs/Self Care Home Management;Biofeedback;Cryotherapy;Electrical Stimulation;Iontophoresis 4mg /ml Dexamethasone;Moist Heat;Ultrasound;Functional mobility training;Therapeutic activities;Therapeutic exercise;Balance training;Neuromuscular re-education;Patient/family education;Manual techniques;Scar mobilization;Passive range of motion;Dry needling;Taping    PT Next Visit Plan  review HEP and goals; focus on reaching full, painless ROM and postural training, STM to R deltoid and teres musculature     PT Home Exercise Plan  Eval: supine flexion AAROM, supine ABD AAROM, standing ER doorway PROM stretch     Consulted and Agree with Plan of Care  Patient       Patient will benefit from skilled therapeutic intervention in order to improve the following deficits and impairments:  Increased fascial restricitons, Improper body mechanics, Pain, Decreased coordination, Decreased mobility, Decreased scar mobility, Increased  muscle spasms, Postural dysfunction, Decreased range of motion, Decreased strength, Hypomobility, Impaired UE functional use, Impaired flexibility  Visit Diagnosis: Stiffness of right shoulder, not elsewhere classified - Plan: PT plan of care cert/re-cert  Chronic right shoulder pain - Plan: PT plan of care cert/re-cert  Muscle weakness (generalized) - Plan: PT plan of care cert/re-cert  Abnormal posture - Plan: PT plan of care cert/re-cert     Problem List Patient Active Problem List   Diagnosis Date Noted  . Status post arthroscopy of right shoulder 07/04/2017  . Impingement syndrome of right shoulder 10/04/2016  . Pulmonary sarcoidosis ([place]) 11/28/2015  . Cough 11/12/2015    Deniece Ree PT, DPT, CBIS  Supplemental Physical Therapist [place]   Pager [place] [facility] [address] [place], [place], [address] Phone: [phone]   Fax:  [phone]  Name: Robert Morrison MRN: [id number] Date of Birth: 1955/03/19

## 2017-07-07 NOTE — Patient Instructions (Signed)
   WRIST GRAB FLEXION - SUPINE - AAROM SHOULDER FLEXION  While lying on your back with your arm at your side, grasp your affected arm and slowly raise it up upwards and towards overhead.  Your affected arm should be relaxed and your other arm performing the work.  Repeat 10-15 times, 2-3 times per day    AAROM SHOULDER ABDUCTION - WAND (DO THIS EXERCISE LAYING DOWN!!)  While holding a wand/cane palm face up on the injured side and palm face down on the uninjured side, slowly raise up your injured arm to the side.    Repeat 10-15 times,  2-3 times per day.     External Rotation Door Pivot Stretch  Standing in a door frame, place a small pillow or rolled up towel underneath the elbow of your affected shoulder. Place your wrist on the outside of the door frame, elbow should be bent at 90 degrees, then begin rotating inward toward unaffected shoulder/away from affected shoulder until you feel a stretch in the shoulder.   Hold for 3-5 seconds then relax.   Repeat 10-15 times, 2-3 times a day

## 2017-07-13 ENCOUNTER — Encounter: Payer: Self-pay | Admitting: Physical Therapy

## 2017-07-13 ENCOUNTER — Ambulatory Visit: Payer: 59 | Admitting: Physical Therapy

## 2017-07-13 DIAGNOSIS — R293 Abnormal posture: Secondary | ICD-10-CM

## 2017-07-13 DIAGNOSIS — M25511 Pain in right shoulder: Secondary | ICD-10-CM

## 2017-07-13 DIAGNOSIS — M25611 Stiffness of right shoulder, not elsewhere classified: Secondary | ICD-10-CM | POA: Diagnosis not present

## 2017-07-13 DIAGNOSIS — M6281 Muscle weakness (generalized): Secondary | ICD-10-CM

## 2017-07-13 DIAGNOSIS — G8929 Other chronic pain: Secondary | ICD-10-CM

## 2017-07-13 NOTE — Therapy (Signed)
[place], [place], [address] Phone: [phone]   Fax:  [phone]  Physical Therapy Treatment  Patient Details  Name: Robert Morrison MRN: [id number] Date of Birth: 24-Jan-1956 Referring Provider: Jean Rosenthal    Encounter Date: 07/13/2017  PT End of Session - 07/13/17 0938    Visit Number  2    Number of Visits  17    Date for PT Re-Evaluation  08/04/17    Authorization Type  [employer]     Authorization Time Period  07/07/17 to 09/06/17    PT Start Time  0933    PT Stop Time  1020    PT Time Calculation (min)  47 min    Activity Tolerance  Patient tolerated treatment well    Behavior During Therapy  [facility] for tasks assessed/performed       Past Medical History:  Diagnosis Date  . Allergy    takes Claritin daily  . GERD (gastroesophageal reflux disease)    takes OMeprazole daily  . History of bronchitis    around 08  . History of colon polyps    benign  . Hyperlipidemia   . Pneumonia    hx of couple of yrs ago  . PONV (postoperative nausea and vomiting)     Past Surgical History:  Procedure Laterality Date  . COLONOSCOPY  12-05-2009  . COLONOSCOPY    . ELBOW FRACTURE SURGERY Left 1998  . ENDOBRONCHIAL ULTRASOUND Bilateral 12/08/2015   Procedure: ENDOBRONCHIAL ULTRASOUND;  Surgeon: Juanito Doom, MD;  Location: [facility];  Service: Cardiopulmonary;  Laterality: Bilateral;  . MEDIASTINOSCOPY N/A 01/29/2016   Procedure: MEDIASTINOSCOPY;  Surgeon: Melrose Nakayama, MD;  Location: [place];  Service: Thoracic;  Laterality: N/A;  . POLYPECTOMY    . ROTATOR CUFF REPAIR Left 2010    There were no vitals filed for this visit.  Subjective Assessment - 07/13/17 0934    Subjective  Its not bad, i'm just aggravated.  I feel like I'm behind.      Currently in Pain?  Yes    Pain Score  2     Pain Location  Shoulder    Pain Orientation  Right    Pain Descriptors /  Indicators  Nagging;Sore    Pain Type  Surgical pain    Pain Onset  1 to 4 weeks ago    Pain Frequency  Intermittent    Aggravating Factors   over-reaching     Pain Relieving Factors  Aleve             [facility] Adult PT Treatment/Exercise - 07/13/17 0001      Shoulder Exercises: Supine   Horizontal ABduction  AAROM;Both;10 reps    External Rotation  AAROM;Left;10 reps    Flexion  AAROM;Both;10 reps    ABduction  AAROM;Left;10 reps      Shoulder Exercises: Pulleys   Flexion  3 minutes    Scaption  2 minutes      Shoulder Exercises: Stretch   Table Stretch - Flexion  10 seconds;Other (comment) x 10 reps     Table Stretch - Abduction  10 seconds;Other (comment) R     Table Stretch - External Rotation  10 seconds;Other (comment) R UE on table       Cryotherapy   Number Minutes Cryotherapy  8 Minutes    Cryotherapy Location  Shoulder    Type of Cryotherapy  Ice pack      Manual Therapy  Manual Therapy  Joint mobilization;Passive ROM    Joint Mobilization  Oscillations, joint distraction multiple planes, Gr I inferior glide    Passive ROM  all planes, limited mostly in ER              PT Education - 07/13/17 0937    Education provided  Yes    Education Details  progress , HEP, ROM     Person(s) Educated  Patient    Methods  Explanation    Comprehension  Verbalized understanding;Returned demonstration       PT Short Term Goals - 07/07/17 1245      PT SHORT TERM GOAL #1   Title  Patient to demonstrate R shoulder AAROM and AROM as being full and pain free in order to improve gross shoulder mobility and mechanics     Time  4    Period  Weeks    Status  New    Target Date  08/04/17      PT SHORT TERM GOAL #2   Title  Patient to demonstrate resolutation of postural deficits in order to support healthy shoulder mechanics and reduce compensation patterns     Time  4    Period  Weeks    Status  New      PT SHORT TERM GOAL #3   Title  Patient to report pain as  being 0/10 in R shoulder with all functional tasks and activities in order to improve QOL and functional task tolerance     Time  4    Period  Weeks    Status  New      PT SHORT TERM GOAL #4   Title  Patient to be independent in correct performance of appropriate HEP, to be updated PRN     Time  1    Period  Weeks    Status  New    Target Date  07/14/17        PT Long Term Goals - 07/07/17 1247      PT LONG TERM GOAL #1   Title  Patient to demonstrate functional strength of 5/5 in all tested muscles in order to show improved strength and stability of R shoulder     Time  8    Period  Weeks    Status  New    Target Date  09/01/17      PT LONG TERM GOAL #2   Title  Patient to be able to perform functional overhead activities without compensation patterns or scapular winging in order to ensure good shoulder mechanics     Time  8    Period  Weeks    Status  New      PT LONG TERM GOAL #3   Title  Patient to be able to perform all self-care activities such as dressing/bathing without pain or ROM limitations in order to improve QOL     Time  8    Period  Weeks    Status  New      PT LONG TERM GOAL #4   Title  Patient to be able to perform all functional daily tasks and activities without pain in order to improve QOL and demonstrate restored shoulder function     Time  8    Period  Weeks    Status  New            Plan - 07/13/17 6387    Clinical Impression Statement  Patient tolerated stretching well.  He has difficulty finding a comfortable position in bed, has to sleep in the recliner.  Gave more AAROM exercises per her request.  Pain overall minimal.      PT Treatment/Interventions  ADLs/Self Care Home Management;Biofeedback;Cryotherapy;Electrical Stimulation;Iontophoresis 4mg /ml Dexamethasone;Moist Heat;Ultrasound;Functional mobility training;Therapeutic activities;Therapeutic exercise;Balance training;Neuromuscular re-education;Patient/family education;Manual  techniques;Scar mobilization;Passive range of motion;Dry needling;Taping    PT Next Visit Plan  review HEP and goals; focus on reaching full, painless ROM and postural training, STM to R deltoid and teres musculature     PT Home Exercise Plan  Eval: supine flexion AAROM, supine ABD AAROM, standing ER doorway PROM stretch     Consulted and Agree with Plan of Care  Patient       Patient will benefit from skilled therapeutic intervention in order to improve the following deficits and impairments:  Increased fascial restricitons, Improper body mechanics, Pain, Decreased coordination, Decreased mobility, Decreased scar mobility, Increased muscle spasms, Postural dysfunction, Decreased range of motion, Decreased strength, Hypomobility, Impaired UE functional use, Impaired flexibility  Visit Diagnosis: Stiffness of right shoulder, not elsewhere classified  Chronic right shoulder pain  Muscle weakness (generalized)  Abnormal posture     Problem List Patient Active Problem List   Diagnosis Date Noted  . Status post arthroscopy of right shoulder 07/04/2017  . Impingement syndrome of right shoulder 10/04/2016  . Pulmonary sarcoidosis ([place]) 11/28/2015  . Cough 11/12/2015    Kendyll Huettner 07/13/2017, 10:24 AM  [facility] [address] [place], [place], [address] Phone: [phone]   Fax:  [phone]  Name: Robert Morrison MRN: [id number] Date of Birth: 1955-11-12  Raeford Razor, PT 07/13/17 10:25 AM Phone: [phone] Fax: [phone]

## 2017-07-18 ENCOUNTER — Encounter (INDEPENDENT_AMBULATORY_CARE_PROVIDER_SITE_OTHER): Payer: Self-pay | Admitting: Orthopaedic Surgery

## 2017-07-18 ENCOUNTER — Ambulatory Visit (INDEPENDENT_AMBULATORY_CARE_PROVIDER_SITE_OTHER): Payer: 59 | Admitting: Orthopaedic Surgery

## 2017-07-18 DIAGNOSIS — Z9889 Other specified postprocedural states: Secondary | ICD-10-CM

## 2017-07-18 NOTE — Progress Notes (Signed)
The patient is here in follow-up almost 2 months status post a right shoulder arthroscopy with subacromial decompression.  He is in physical therapy now and does report some improvements with his range of motion as well as decreased pain.  On exam he is forward flexion and abduction is improved but not full.  His internal rotation with adduction is still reaching to only the beltline.  Patient will continue aggressive physical therapy on his shoulder.  He has improved from when I saw him 2 weeks ago.  I do feel he would benefit from a steroid injection in the subacromial space today and agreed with this on the right shoulder.  He tolerated it well.  We will see him back in 4 weeks to see how he is done from a therapy standpoint.

## 2017-07-19 ENCOUNTER — Ambulatory Visit: Payer: 59 | Attending: Orthopaedic Surgery | Admitting: Physical Therapy

## 2017-07-19 DIAGNOSIS — R293 Abnormal posture: Secondary | ICD-10-CM | POA: Insufficient documentation

## 2017-07-19 DIAGNOSIS — M25511 Pain in right shoulder: Secondary | ICD-10-CM | POA: Insufficient documentation

## 2017-07-19 DIAGNOSIS — M6281 Muscle weakness (generalized): Secondary | ICD-10-CM | POA: Diagnosis present

## 2017-07-19 DIAGNOSIS — G8929 Other chronic pain: Secondary | ICD-10-CM | POA: Diagnosis present

## 2017-07-19 DIAGNOSIS — M25611 Stiffness of right shoulder, not elsewhere classified: Secondary | ICD-10-CM | POA: Insufficient documentation

## 2017-07-19 NOTE — Therapy (Signed)
[place], [place], [address] Phone: [phone]   Fax:  [phone]  Physical Therapy Treatment  Patient Details  Name: Robert Morrison MRN: [id number] Date of Birth: 01-22-1956 Referring Provider: Jean Rosenthal    Encounter Date: 07/19/2017  PT End of Session - 07/19/17 1254    Visit Number  3    Number of Visits  17    Date for PT Re-Evaluation  08/04/17    PT Start Time  1103    PT Stop Time  1156    PT Time Calculation (min)  53 min    Activity Tolerance  Patient tolerated treatment well    Behavior During Therapy  [facility] for tasks assessed/performed       Past Medical History:  Diagnosis Date  . Allergy    takes Claritin daily  . GERD (gastroesophageal reflux disease)    takes OMeprazole daily  . History of bronchitis    around 08  . History of colon polyps    benign  . Hyperlipidemia   . Pneumonia    hx of couple of yrs ago  . PONV (postoperative nausea and vomiting)     Past Surgical History:  Procedure Laterality Date  . COLONOSCOPY  12-05-2009  . COLONOSCOPY    . ELBOW FRACTURE SURGERY Left 1998  . ENDOBRONCHIAL ULTRASOUND Bilateral 12/08/2015   Procedure: ENDOBRONCHIAL ULTRASOUND;  Surgeon: Juanito Doom, MD;  Location: [facility];  Service: Cardiopulmonary;  Laterality: Bilateral;  . MEDIASTINOSCOPY N/A 01/29/2016   Procedure: MEDIASTINOSCOPY;  Surgeon: Melrose Nakayama, MD;  Location: [place];  Service: Thoracic;  Laterality: N/A;  . POLYPECTOMY    . ROTATOR CUFF REPAIR Left 2010    There were no vitals filed for this visit.  Subjective Assessment - 07/19/17 1107    Subjective  Saw MD yesterday,  Had cortizone shot.  Deltoid recently feeling better,      Currently in Pain?  No/denies    Pain Score  -- with reaching 3/10    Pain Location  Shoulder    Pain Orientation  Right    Pain Descriptors / Indicators  Dull;Aching;Sore    Aggravating Factors   moving      Pain Relieving Factors  Aleve,  injection    Effect of Pain on Daily Activities  moderate         [facility] PT Assessment - 07/19/17 0001      PROM   Right Shoulder Flexion  122 Degrees                   [facility] Adult PT Treatment/Exercise - 07/19/17 0001      Shoulder Exercises: Supine   Horizontal ABduction  AAROM 10 X cane  feels good    External Rotation  AAROM ,2 sets 10 x cane   and with fist on forehead    Flexion  AAROM 10 x Cane  122    ABduction  AAROM;10 reps cane cues    Other Supine Exercises  chest press      Shoulder Exercises: Stretch   External Rotation Stretch  10 seconds 10 X door way single,  mod cues    Other Shoulder Stretches  self mobs with rolled towel posterior add and distraction 10 X 10 seconds,  improved reach to right SI      Cryotherapy   Number Minutes Cryotherapy  8 Minutes    Cryotherapy Location  Shoulder    Type of  Cryotherapy  -- cold pack      Manual Therapy   Kinesiotex  Inhibit Muscle      Kinesiotix   Inhibit Muscle   Deltoid,  supraspinatus             PT Education - 07/19/17 1254    Education provided  Yes    Education Details  exercise form,  different ways to stretch    Person(s) Educated  Patient    Methods  Explanation;Demonstration;Verbal cues    Comprehension  Verbalized understanding;Returned demonstration       PT Short Term Goals - 07/07/17 1245      PT SHORT TERM GOAL #1   Title  Patient to demonstrate R shoulder AAROM and AROM as being full and pain free in order to improve gross shoulder mobility and mechanics     Time  4    Period  Weeks    Status  New    Target Date  08/04/17      PT SHORT TERM GOAL #2   Title  Patient to demonstrate resolutation of postural deficits in order to support healthy shoulder mechanics and reduce compensation patterns     Time  4    Period  Weeks    Status  New      PT SHORT TERM GOAL #3   Title  Patient to report pain as being 0/10 in R shoulder with all  functional tasks and activities in order to improve QOL and functional task tolerance     Time  4    Period  Weeks    Status  New      PT SHORT TERM GOAL #4   Title  Patient to be independent in correct performance of appropriate HEP, to be updated PRN     Time  1    Period  Weeks    Status  New    Target Date  07/14/17        PT Long Term Goals - 07/07/17 1247      PT LONG TERM GOAL #1   Title  Patient to demonstrate functional strength of 5/5 in all tested muscles in order to show improved strength and stability of R shoulder     Time  8    Period  Weeks    Status  New    Target Date  09/01/17      PT LONG TERM GOAL #2   Title  Patient to be able to perform functional overhead activities without compensation patterns or scapular winging in order to ensure good shoulder mechanics     Time  8    Period  Weeks    Status  New      PT LONG TERM GOAL #3   Title  Patient to be able to perform all self-care activities such as dressing/bathing without pain or ROM limitations in order to improve QOL     Time  8    Period  Weeks    Status  New      PT LONG TERM GOAL #4   Title  Patient to be able to perform all functional daily tasks and activities without pain in order to improve QOL and demonstrate restored shoulder function     Time  8    Period  Weeks    Status  New            Plan - 07/19/17 1255    Clinical Impression Statement  120 AA flexion.  no pain at rest mild pain with stretching.  he recently had a cortizone shot ( Yesterday)  ROM improving.  Has been sleeping in bed 2 nights in a row.    PT Next Visit Plan  review HEP and goals; focus on reaching full, painless ROM and postural training, STM to R deltoid and teres musculature     PT Home Exercise Plan  Eval: supine flexion AAROM, supine ABD AAROM, standing ER doorway PROM stretch     Consulted and Agree with Plan of Care  Patient       Patient will benefit from skilled therapeutic intervention in order  to improve the following deficits and impairments:     Visit Diagnosis: Stiffness of right shoulder, not elsewhere classified  Chronic right shoulder pain  Muscle weakness (generalized)  Abnormal posture     Problem List Patient Active Problem List   Diagnosis Date Noted  . Status post arthroscopy of right shoulder 07/04/2017  . Impingement syndrome of right shoulder 10/04/2016  . Pulmonary sarcoidosis ([place]) 11/28/2015  . Cough 11/12/2015    HARRIS,KAREN PTA 07/19/2017, 12:58 PM  [facility] [address] [place], [place], [address] Phone: [phone]   Fax:  [phone]  Name: Robert Morrison MRN: [id number] Date of Birth: [birth year]

## 2017-07-21 ENCOUNTER — Ambulatory Visit: Payer: 59 | Admitting: Physical Therapy

## 2017-07-21 ENCOUNTER — Encounter: Payer: Self-pay | Admitting: Physical Therapy

## 2017-07-21 DIAGNOSIS — M6281 Muscle weakness (generalized): Secondary | ICD-10-CM

## 2017-07-21 DIAGNOSIS — M25611 Stiffness of right shoulder, not elsewhere classified: Secondary | ICD-10-CM | POA: Diagnosis not present

## 2017-07-21 DIAGNOSIS — G8929 Other chronic pain: Secondary | ICD-10-CM

## 2017-07-21 DIAGNOSIS — M25511 Pain in right shoulder: Secondary | ICD-10-CM

## 2017-07-21 NOTE — Therapy (Signed)
Robert [place], [place], [address] Phone: [phone]   Fax:  [phone]  Physical Therapy Treatment  Patient Details  Name: Robert Morrison MRN: [id number] Date of Birth: Sep 08, 1955 Referring Provider: Jean Rosenthal    Encounter Date: 07/21/2017  PT End of Session - 07/21/17 0927    Visit Number  4    Number of Visits  17    Date for PT Re-Evaluation  08/04/17    PT Start Time  0930    PT Stop Time  1016    PT Time Calculation (min)  46 min    Activity Tolerance  Patient tolerated treatment well    Behavior During Therapy  [facility] for tasks assessed/performed       Past Medical History:  Diagnosis Date  . Allergy    takes Claritin daily  . GERD (gastroesophageal reflux disease)    takes OMeprazole daily  . History of bronchitis    around 08  . History of colon polyps    benign  . Hyperlipidemia   . Pneumonia    hx of couple of yrs ago  . PONV (postoperative nausea and vomiting)     Past Surgical History:  Procedure Laterality Date  . COLONOSCOPY  12-05-2009  . COLONOSCOPY    . ELBOW FRACTURE SURGERY Left 1998  . ENDOBRONCHIAL ULTRASOUND Bilateral 12/08/2015   Procedure: ENDOBRONCHIAL ULTRASOUND;  Surgeon: Juanito Doom, MD;  [facility];  Service: Cardiopulmonary;  Laterality: Bilateral;  . MEDIASTINOSCOPY N/A 01/29/2016   Procedure: MEDIASTINOSCOPY;  Surgeon: Melrose Nakayama, MD;  Location: [place];  Service: Thoracic;  Laterality: N/A;  . POLYPECTOMY    . ROTATOR CUFF REPAIR Left 2010    There were no vitals filed for this visit.  Subjective Assessment - 07/21/17 0930    Subjective  "things aren't going too bad, the shot some soreness but not really having as much pain"    Currently in Pain?  Yes    Pain Score  1     Pain Location  Shoulder    Pain Orientation  Right    Pain Descriptors / Indicators  Sore    Pain Type  Surgical pain    Pain Onset  1 to 4  weeks ago    Pain Frequency  Intermittent                       [facility] Adult PT Treatment/Exercise - 07/21/17 0947      Shoulder Exercises: Seated   Protraction  Strengthening;Right;10 reps;Theraband using UE ranger with red theraband for resistance    Other Seated Exercises  lower trap strengthening with elbows onbolster attmpetd using yellow band due to limiation removed band and performed without band 2 x 10      Shoulder Exercises: Standing   External Rotation  --    Theraband Level (Shoulder External Rotation)  --    Internal Rotation  --    Theraband Level (Shoulder Internal Rotation)  --      Shoulder Exercises: Isometric Strengthening   Flexion  5X5"    Extension  5X5"    External Rotation  5X5"    Internal Rotation  5X5"      Shoulder Exercises: Stretch   External Rotation Stretch  2 reps;30 seconds      Manual Therapy   Manual Therapy  Scapular mobilization    Manual therapy comments  MTPR along R bicep and pec major  Scapular Mobilization  grade 3 in all planes    Passive ROM  all planes, limited mostly in ER              PT Education - 07/21/17 1024    Education provided  Yes    Education Details  updated HEP for isometrics    Person(s) Educated  Patient    Methods  Explanation;Verbal cues;Handout    Comprehension  Verbalized understanding;Verbal cues required       PT Short Term Goals - 07/07/17 1245      PT SHORT TERM GOAL #1   Title  Patient to demonstrate R shoulder AAROM and AROM as being full and pain free in order to improve gross shoulder mobility and mechanics     Time  4    Period  Weeks    Status  New    Target Date  08/04/17      PT SHORT TERM GOAL #2   Title  Patient to demonstrate resolutation of postural deficits in order to support healthy shoulder mechanics and reduce compensation patterns     Time  4    Period  Weeks    Status  New      PT SHORT TERM GOAL #3   Title  Patient to report pain as being 0/10 in  R shoulder with all functional tasks and activities in order to improve QOL and functional task tolerance     Time  4    Period  Weeks    Status  New      PT SHORT TERM GOAL #4   Title  Patient to be independent in correct performance of appropriate HEP, to be updated PRN     Time  1    Period  Weeks    Status  New    Target Date  07/14/17        PT Long Term Goals - 07/07/17 1247      PT LONG TERM GOAL #1   Title  Patient to demonstrate functional strength of 5/5 in all tested muscles in order to show improved strength and stability of R shoulder     Time  8    Period  Weeks    Status  New    Target Date  09/01/17      PT LONG TERM GOAL #2   Title  Patient to be able to perform functional overhead activities without compensation patterns or scapular winging in order to ensure good shoulder mechanics     Time  8    Period  Weeks    Status  New      PT LONG TERM GOAL #3   Title  Patient to be able to perform all self-care activities such as dressing/bathing without pain or ROM limitations in order to improve QOL     Time  8    Period  Weeks    Status  New      PT LONG TERM GOAL #4   Title  Patient to be able to perform all functional daily tasks and activities without pain in order to improve QOL and demonstrate restored shoulder function     Time  8    Period  Weeks    Status  New            Plan - 07/21/17 1025    Clinical Impression Statement  Continued working on shoulder / scapular mob today. Progrressed to strengthening utilzing isometrics which he tolerated well  and provided handout for HEP. He reported no increase in pain and declined modalities.     PT Next Visit Plan  review HEP and goals; focus on reaching full, painless ROM and postural training, STM to R deltoid and teres musculature     PT Home Exercise Plan  Eval: supine flexion AAROM, supine ABD AAROM, standing ER doorway PROM stretch, isometrics     Consulted and Agree with Plan of Care  Patient        Patient will benefit from skilled therapeutic intervention in order to improve the following deficits and impairments:     Visit Diagnosis: Stiffness of right shoulder, not elsewhere classified  Chronic right shoulder pain  Muscle weakness (generalized)     Problem List Patient Active Problem List   Diagnosis Date Noted  . Status post arthroscopy of right shoulder 07/04/2017  . Impingement syndrome of right shoulder 10/04/2016  . Pulmonary sarcoidosis ([place]) 11/28/2015  . Cough 11/12/2015   Starr Lake PT, DPT, LAT, ATC  07/21/17  10:29 AM      [facility] [address] [place], [place], [address] Phone: [phone]   Fax:  [phone]  Name: Robert Morrison MRN: [id number] Date of Birth: January 08, 1956

## 2017-07-26 ENCOUNTER — Encounter: Payer: Self-pay | Admitting: Physical Therapy

## 2017-07-26 ENCOUNTER — Ambulatory Visit: Payer: 59 | Admitting: Physical Therapy

## 2017-07-26 DIAGNOSIS — G8929 Other chronic pain: Secondary | ICD-10-CM

## 2017-07-26 DIAGNOSIS — M25511 Pain in right shoulder: Secondary | ICD-10-CM

## 2017-07-26 DIAGNOSIS — M25611 Stiffness of right shoulder, not elsewhere classified: Secondary | ICD-10-CM

## 2017-07-26 DIAGNOSIS — M6281 Muscle weakness (generalized): Secondary | ICD-10-CM

## 2017-07-26 DIAGNOSIS — R293 Abnormal posture: Secondary | ICD-10-CM

## 2017-07-26 NOTE — Patient Instructions (Signed)
   WALL WALK  Place your affected hand on the wall with the palm facing the wall. Next, walk your fingers up the wall towards overhead. Lastly, slide your hand back down the wall to the starting position.   When you are at the top of the motion and feel a stretch, hold for at least 10 seconds, then relax.  Repeat 10-15 times, 2-3 times per day.     Shoulder Abduction AAROM Tabletop Exercise  Start by positioning chair and body so that they are perpendicular to the table, far enough away that forearm of affected side can rest on table.  With forearm resting on table and palm facing upward, bring head down toward arm and slowly slide arm away from body, keeping trunk away from table. Hold this position for 5-10 seconds when you start to feel a stretch in the affected shoulder, then slowly slide arm back to the starting position.  Repeat 10-15 times, 2-3 times per day.

## 2017-07-26 NOTE — Therapy (Signed)
[place], [place], [address] Phone: [phone]   Fax:  [phone]  Physical Therapy Treatment  Patient Details  Name: Robert [place] MRN: [id number] Date of Birth: [birth year] Referring Provider: Jean Rosenthal    Encounter Date: 07/26/2017  PT End of Session - 07/26/17 1415    Visit Number  5    Number of Visits  17    Date for PT Re-Evaluation  08/04/17    Authorization Type  [employer]     Authorization Time Period  07/07/17 to 09/06/17    PT Start Time  1330    PT Stop Time  1412    PT Time Calculation (min)  42 min    Activity Tolerance  Patient tolerated treatment well    Behavior During Therapy  [facility] for tasks assessed/performed       Past Medical History:  Diagnosis Date  . Allergy    takes Claritin daily  . GERD (gastroesophageal reflux disease)    takes OMeprazole daily  . History of bronchitis    around 08  . History of colon polyps    benign  . Hyperlipidemia   . Pneumonia    hx of couple of yrs ago  . PONV (postoperative nausea and vomiting)     Past Surgical History:  Procedure Laterality Date  . COLONOSCOPY  12-05-2009  . COLONOSCOPY    . ELBOW FRACTURE SURGERY Left 1998  . ENDOBRONCHIAL ULTRASOUND Bilateral 12/08/2015   Procedure: ENDOBRONCHIAL ULTRASOUND;  Surgeon: Juanito Doom, MD;  Location: [facility];  Service: Cardiopulmonary;  Laterality: Bilateral;  . MEDIASTINOSCOPY N/A 01/29/2016   Procedure: MEDIASTINOSCOPY;  Surgeon: Melrose Nakayama, MD;  Location: [facility];  Service: Thoracic;  Laterality: N/A;  . POLYPECTOMY    . ROTATOR CUFF REPAIR Left 2010    There were no vitals filed for this visit.  Subjective Assessment - 07/26/17 1330    Subjective  I am feeling good today, my shoulder doesn't really hurt unless I start doing things with it     Patient Stated Goals  get my right shoulder as good as my left shoulder     Currently in Pain?   Yes    Pain Score  1     Pain Location  Shoulder    Pain Orientation  Right    Pain Descriptors / Indicators  Sore    Pain Type  Surgical pain    Pain Radiating Towards  none     Pain Onset  1 to 4 weeks ago    Pain Frequency  Intermittent    Aggravating Factors   moving     Pain Relieving Factors  medicines     Effect of Pain on Daily Activities  moderate                        [facility] Adult PT Treatment/Exercise - 07/26/17 0001      Shoulder Exercises: Supine   External Rotation  PROM;15 reps    Internal Rotation  PROM;Right;15 reps    Flexion  PROM;15 reps    ABduction  PROM;15 reps      Shoulder Exercises: Standing   Other Standing Exercises  wall walks 15x10 second holds to tolerance     Other Standing Exercises  standing shoulder extension walks x10 with 5 second holds; standing shoulder pendumlum circles with 5# for shoulder distraction       Manual Therapy  Manual Therapy  Joint mobilization    Manual therapy comments  separate from all other skilled services     Joint Mobilization  grade III GH anterior and inferior joint mobilizations to tolerance              PT Education - 07/26/17 1414    Education provided  Yes    Education Details  benefits of focus on ROM prior to working aggressively on strength, POC moving forward, HEP updates     Person(s) Educated  Patient    Methods  Explanation;Handout;Demonstration    Comprehension  Verbalized understanding;Returned demonstration       PT Short Term Goals - 07/07/17 1245      PT SHORT TERM GOAL #1   Title  Patient to demonstrate R shoulder AAROM and AROM as being full and pain free in order to improve gross shoulder mobility and mechanics     Time  4    Period  Weeks    Status  New    Target Date  08/04/17      PT SHORT TERM GOAL #2   Title  Patient to demonstrate resolutation of postural deficits in order to support healthy shoulder mechanics and reduce compensation patterns     Time  4     Period  Weeks    Status  New      PT SHORT TERM GOAL #3   Title  Patient to report pain as being 0/10 in R shoulder with all functional tasks and activities in order to improve QOL and functional task tolerance     Time  4    Period  Weeks    Status  New      PT SHORT TERM GOAL #4   Title  Patient to be independent in correct performance of appropriate HEP, to be updated PRN     Time  1    Period  Weeks    Status  New    Target Date  07/14/17        PT Long Term Goals - 07/07/17 1247      PT LONG TERM GOAL #1   Title  Patient to demonstrate functional strength of 5/5 in all tested muscles in order to show improved strength and stability of R shoulder     Time  8    Period  Weeks    Status  New    Target Date  09/01/17      PT LONG TERM GOAL #2   Title  Patient to be able to perform functional overhead activities without compensation patterns or scapular winging in order to ensure good shoulder mechanics     Time  8    Period  Weeks    Status  New      PT LONG TERM GOAL #3   Title  Patient to be able to perform all self-care activities such as dressing/bathing without pain or ROM limitations in order to improve QOL     Time  8    Period  Weeks    Status  New      PT LONG TERM GOAL #4   Title  Patient to be able to perform all functional daily tasks and activities without pain in order to improve QOL and demonstrate restored shoulder function     Time  8    Period  Weeks    Status  New            Plan -  07/26/17 1415    Clinical Impression Statement  Focused today's session on PROM and AAROM based activities today due to ongoing shoulder stiffness and joint hypomobility; introduced activities such as GH joint mobilizations and wall walks to assist in improving general shoulder joint ROM. Of note, patient reports joint limitation prior to surgery which may limit ability to return to completely full ROM, will follow closely moving forward.     Rehab Potential   Good    Clinical Impairments Affecting Rehab Potential  (+) appears motivated to participate, success with PT in the past; (-) poor awareness of safety and compensations present     PT Frequency  2x / week    PT Duration  8 weeks    PT Treatment/Interventions  ADLs/Self Care Home Management;Biofeedback;Cryotherapy;Electrical Stimulation;Iontophoresis 4mg /ml Dexamethasone;Moist Heat;Ultrasound;Functional mobility training;Therapeutic activities;Therapeutic exercise;Balance training;Neuromuscular re-education;Patient/family education;Manual techniques;Scar mobilization;Passive range of motion;Dry needling;Taping    PT Next Visit Plan  continue working on ROM primarily, STM, postural training. Introduced Radio broadcast assistant for [employer]. Continue taping.     PT Home Exercise Plan  Eval: supine flexion AAROM, supine ABD AAROM, standing ER doorway PROM stretch, isometrics, wall walks, seated ABD stretch     Consulted and Agree with Plan of Care  Patient       Patient will benefit from skilled therapeutic intervention in order to improve the following deficits and impairments:  Increased fascial restricitons, Improper body mechanics, Pain, Decreased coordination, Decreased mobility, Decreased scar mobility, Increased muscle spasms, Postural dysfunction, Decreased range of motion, Decreased strength, Hypomobility, Impaired UE functional use, Impaired flexibility  Visit Diagnosis: Stiffness of right shoulder, not elsewhere classified  Chronic right shoulder pain  Muscle weakness (generalized)  Abnormal posture     Problem List Patient Active Problem List   Diagnosis Date Noted  . Status post arthroscopy of right shoulder 07/04/2017  . Impingement syndrome of right shoulder 10/04/2016  . Pulmonary sarcoidosis ([place]) 11/28/2015  . Cough 11/12/2015    Deniece Ree PT, DPT, CBIS  Supplemental Physical Therapist [place]   Pager [place]  [facility] [name] [place], [place], [address] Phone: [phone]   Fax:  [phone]  Name: Robert [place] MRN: [id number] Date of Birth: 1955-03-24

## 2017-07-28 ENCOUNTER — Encounter: Payer: Self-pay | Admitting: Physical Therapy

## 2017-07-28 ENCOUNTER — Ambulatory Visit: Payer: 59 | Admitting: Physical Therapy

## 2017-07-28 DIAGNOSIS — M6281 Muscle weakness (generalized): Secondary | ICD-10-CM

## 2017-07-28 DIAGNOSIS — R293 Abnormal posture: Secondary | ICD-10-CM

## 2017-07-28 DIAGNOSIS — G8929 Other chronic pain: Secondary | ICD-10-CM

## 2017-07-28 DIAGNOSIS — M25511 Pain in right shoulder: Secondary | ICD-10-CM

## 2017-07-28 DIAGNOSIS — M25611 Stiffness of right shoulder, not elsewhere classified: Secondary | ICD-10-CM | POA: Diagnosis not present

## 2017-07-28 NOTE — Therapy (Signed)
[place], [place], [address] Phone: [phone]   Fax:  [phone]  Physical Therapy Treatment  Patient Details  Name: Robert [place] MRN: [id number] Date of Birth: Jan 21, 1956 Referring Provider: Jean Rosenthal    Encounter Date: 07/28/2017  PT End of Session - 07/28/17 1545    Visit Number  6    Number of Visits  17    Date for PT Re-Evaluation  08/04/17    PT Start Time  1501    PT Stop Time  1545    PT Time Calculation (min)  44 min    Activity Tolerance  Patient tolerated treatment well    Behavior During Therapy  [facility] for tasks assessed/performed       Past Medical History:  Diagnosis Date  . Allergy    takes Claritin daily  . GERD (gastroesophageal reflux disease)    takes OMeprazole daily  . History of bronchitis    around 08  . History of colon polyps    benign  . Hyperlipidemia   . Pneumonia    hx of couple of yrs ago  . PONV (postoperative nausea and vomiting)     Past Surgical History:  Procedure Laterality Date  . COLONOSCOPY  12-05-2009  . COLONOSCOPY    . ELBOW FRACTURE SURGERY Left 1998  . ENDOBRONCHIAL ULTRASOUND Bilateral 12/08/2015   Procedure: ENDOBRONCHIAL ULTRASOUND;  Surgeon: Juanito Doom, MD;  Location: [facility];  Service: Cardiopulmonary;  Laterality: Bilateral;  . MEDIASTINOSCOPY N/A 01/29/2016   Procedure: MEDIASTINOSCOPY;  Surgeon: Melrose Nakayama, MD;  Location: [facility];  Service: Thoracic;  Laterality: N/A;  . POLYPECTOMY    . ROTATOR CUFF REPAIR Left 2010    There were no vitals filed for this visit.  Subjective Assessment - 07/28/17 1504    Subjective  "doing pretty good, only have pain with movement more of a stiffness/ soreness"    Currently in Pain?  Yes    Pain Score  1     Pain Orientation  Right                       [facility] Adult PT Treatment/Exercise - 07/28/17 1508      Shoulder Exercises: Seated    Other Seated Exercises  shoulder self distraction 1 x 10 holding 5 sec verbal cues for proper form      Shoulder Exercises: Standing   Row  10 reps;Theraband;Strengthening;Both    Theraband Level (Shoulder Row)  Level 2 (Red)      Shoulder Exercises: Pulleys   Flexion  2 minutes    Scaption  2 minutes      Shoulder Exercises: Stretch   External Rotation Stretch  2 reps;30 seconds      Manual Therapy   Joint Mobilization  grade III GH anterior/ posterior  and inferior joint mobilizations. Distal clavilcle mobs inferior/ posterior grade 2-3     Scapular Mobilization  scapular mobs grade 3-4  in all planes             PT Education - 07/28/17 1545    Education provided  Yes    Education Details  updated HEP     Person(s) Educated  Patient    Methods  Explanation;Verbal cues    Comprehension  Verbalized understanding;Verbal cues required       PT Short Term Goals - 07/07/17 1245      PT SHORT TERM GOAL #1   Title  Patient to demonstrate R shoulder AAROM and AROM as being full and pain free in order to improve gross shoulder mobility and mechanics     Time  4    Period  Weeks    Status  New    Target Date  08/04/17      PT SHORT TERM GOAL #2   Title  Patient to demonstrate resolutation of postural deficits in order to support healthy shoulder mechanics and reduce compensation patterns     Time  4    Period  Weeks    Status  New      PT SHORT TERM GOAL #3   Title  Patient to report pain as being 0/10 in R shoulder with all functional tasks and activities in order to improve QOL and functional task tolerance     Time  4    Period  Weeks    Status  New      PT SHORT TERM GOAL #4   Title  Patient to be independent in correct performance of appropriate HEP, to be updated PRN     Time  1    Period  Weeks    Status  New    Target Date  07/14/17        PT Long Term Goals - 07/07/17 1247      PT LONG TERM GOAL #1   Title  Patient to demonstrate functional strength  of 5/5 in all tested muscles in order to show improved strength and stability of R shoulder     Time  8    Period  Weeks    Status  New    Target Date  09/01/17      PT LONG TERM GOAL #2   Title  Patient to be able to perform functional overhead activities without compensation patterns or scapular winging in order to ensure good shoulder mechanics     Time  8    Period  Weeks    Status  New      PT LONG TERM GOAL #3   Title  Patient to be able to perform all self-care activities such as dressing/bathing without pain or ROM limitations in order to improve QOL     Time  8    Period  Weeks    Status  New      PT LONG TERM GOAL #4   Title  Patient to be able to perform all functional daily tasks and activities without pain in order to improve QOL and demonstrate restored shoulder function     Time  8    Period  Weeks    Status  New            Plan - 07/28/17 1552    Clinical Impression Statement  continued workign on mobility with mobs and AAROM activities today. continued report of 1/10 pain / soreness reported mostly as stiffness. updated HEP for AAROM for ER and inferior distraction mobs. end of session he reported decreased soreness but declined modalities.     PT Treatment/Interventions  ADLs/Self Care Home Management;Biofeedback;Cryotherapy;Electrical Stimulation;Iontophoresis 4mg /ml Dexamethasone;Moist Heat;Ultrasound;Functional mobility training;Therapeutic activities;Therapeutic exercise;Balance training;Neuromuscular re-education;Patient/family education;Manual techniques;Scar mobilization;Passive range of motion;Dry needling;Taping    PT Next Visit Plan  continue working on ROM primarily, STM, postural training. Introduced Radio broadcast assistant for [employer]. Continue taping.     PT Home Exercise Plan  Eval: supine flexion AAROM, supine ABD AAROM, standing ER doorway PROM stretch, isometrics, wall walks, seated ABD stretch  Consulted and Agree with Plan of Care  Patient        Patient will benefit from skilled therapeutic intervention in order to improve the following deficits and impairments:  Increased fascial restricitons, Improper body mechanics, Pain, Decreased coordination, Decreased mobility, Decreased scar mobility, Increased muscle spasms, Postural dysfunction, Decreased range of motion, Decreased strength, Hypomobility, Impaired UE functional use, Impaired flexibility  Visit Diagnosis: Stiffness of right shoulder, not elsewhere classified  Chronic right shoulder pain  Muscle weakness (generalized)  Abnormal posture     Problem List Patient Active Problem List   Diagnosis Date Noted  . Status post arthroscopy of right shoulder 07/04/2017  . Impingement syndrome of right shoulder 10/04/2016  . Pulmonary sarcoidosis ([place]) 11/28/2015  . Cough 11/12/2015   [place]arr [place] PT, DPT, LAT, ATC  07/28/17  4:01 PM      [place] [facility] [address] [place], [place], [address] Phone: [phone]   Fax:  [phone]  Name: EJAY LASHLEY MRN: [id number] Date of Birth: 17-May-1955

## 2017-08-02 ENCOUNTER — Encounter: Payer: Self-pay | Admitting: Physical Therapy

## 2017-08-02 ENCOUNTER — Ambulatory Visit: Payer: 59 | Admitting: Physical Therapy

## 2017-08-02 DIAGNOSIS — R293 Abnormal posture: Secondary | ICD-10-CM

## 2017-08-02 DIAGNOSIS — M25611 Stiffness of right shoulder, not elsewhere classified: Secondary | ICD-10-CM

## 2017-08-02 DIAGNOSIS — M6281 Muscle weakness (generalized): Secondary | ICD-10-CM

## 2017-08-02 DIAGNOSIS — M25511 Pain in right shoulder: Secondary | ICD-10-CM

## 2017-08-02 DIAGNOSIS — G8929 Other chronic pain: Secondary | ICD-10-CM

## 2017-08-02 NOTE — Therapy (Signed)
[place], [place], [address] Phone: [phone]   Fax:  [phone]  Physical Therapy Treatment (ERO)  Patient Details  Name: Robert [place] MRN: [id number] Date of Birth: Jan 19, 1956 Referring Provider: Jean Rosenthal    Encounter Date: 08/02/2017  PT End of Session - 08/02/17 1248    Visit Number  7    Number of Visits  17    Date for PT Re-Evaluation  09/06/17    Authorization Type  [employer]     Authorization Time Period  07/07/17 to 09/06/17    PT Start Time  1102    PT Stop Time  1141    PT Time Calculation (min)  39 min    Activity Tolerance  Patient tolerated treatment well    Behavior During Therapy  [facility] for tasks assessed/performed       Past Medical History:  Diagnosis Date  . Allergy    takes Claritin daily  . GERD (gastroesophageal reflux disease)    takes OMeprazole daily  . History of bronchitis    around 08  . History of colon polyps    benign  . Hyperlipidemia   . Pneumonia    hx of couple of yrs ago  . PONV (postoperative nausea and vomiting)     Past Surgical History:  Procedure Laterality Date  . COLONOSCOPY  12-05-2009  . COLONOSCOPY    . ELBOW FRACTURE SURGERY Left 1998  . ENDOBRONCHIAL ULTRASOUND Bilateral 12/08/2015   Procedure: ENDOBRONCHIAL ULTRASOUND;  Surgeon: Juanito Doom, MD;  Location: [facility];  Service: Cardiopulmonary;  Laterality: Bilateral;  . MEDIASTINOSCOPY N/A 01/29/2016   Procedure: MEDIASTINOSCOPY;  Surgeon: Melrose Nakayama, MD;  Location: [place];  Service: Thoracic;  Laterality: N/A;  . POLYPECTOMY    . ROTATOR CUFF REPAIR Left 2010    There were no vitals filed for this visit.  Subjective Assessment - 08/02/17 1104    Subjective  I'm doing my exercises but not as many as I should be doing, I feel like I should be 100% but I don't know if that's realistic. I'm having trouble with sleep especially if I try to sleep  on that side. Its coming along I guess.     Patient Stated Goals  get my right shoulder as good as my left shoulder     Currently in Pain?  No/denies hurts when I move it especially when I reach          [facility] PT Assessment - 08/02/17 0001      AROM   Right Shoulder Flexion  130 Degrees    Right Shoulder ABduction  110 Degrees    Right Shoulder Internal Rotation  60 Degrees    Right Shoulder External Rotation  40 Degrees      PROM   Right Shoulder Flexion  135 Degrees    Right Shoulder ABduction  111 Degrees    Right Shoulder Internal Rotation  80 Degrees    Right Shoulder External Rotation  50 Degrees      Strength   Right Shoulder Flexion  4/5    Right Shoulder ABduction  4+/5    Right Shoulder Internal Rotation  5/5    Right Shoulder External Rotation  3+/5                   [facility] Adult PT Treatment/Exercise - 08/02/17 0001      Shoulder Exercises: Supine   Other Supine Exercises  R  shoulder flexion, ABD, and ER stretches       Shoulder Exercises: Standing   Extension  Both;15 reps;Theraband    Theraband Level (Shoulder Extension)  Level 2 (Red)    Retraction  15 reps;Theraband    Theraband Level (Shoulder Retraction)  Level 2 (Red)    Other Standing Exercises  wall walks 10x10 second holds       Manual Therapy   Manual Therapy  Joint mobilization    Manual therapy comments  separate from all other skilled services     Joint Mobilization  grade III GH inferior mobilization              PT Education - 08/02/17 1247    Education provided  Yes    Education Details  progress towards goals, POC moving forward, goal review, general shoulder anatomy/functoin/structure     Person(s) Educated  Patient    Methods  Explanation    Comprehension  Verbalized understanding       PT Short Term Goals - 08/02/17 1116      PT SHORT TERM GOAL #1   Title  Patient to demonstrate R shoulder AAROM and AROM as being full and pain free in order to improve gross  shoulder mobility and mechanics     Baseline  6/18- improving, see flowsheets    Time  4    Period  Weeks    Status  On-going      PT SHORT TERM GOAL #2   Title  Patient to demonstrate resolutation of postural deficits in order to support healthy shoulder mechanics and reduce compensation patterns     Baseline  6/18- improving, more aware     Time  4    Period  Weeks    Status  Achieved      PT SHORT TERM GOAL #3   Title  Patient to report pain as being 0/10 in R shoulder with all functional tasks and activities in order to improve QOL and functional task tolerance     Baseline  6/18- 0/10 at rest but can hurt a little when moving     Time  4    Period  Weeks    Status  On-going      PT SHORT TERM GOAL #4   Title  Patient to be independent in correct performance of appropriate HEP, to be updated PRN     Baseline  6/18- "i need to do more sets" but is compliant     Time  1    Status  Achieved        PT Long Term Goals - 08/02/17 1120      PT LONG TERM GOAL #1   Title  Patient to demonstrate functional strength of 5/5 in all tested muscles in order to show improved strength and stability of R shoulder     Baseline  6/18- flowsheets     Time  8    Period  Weeks    Status  On-going      PT LONG TERM GOAL #2   Title  Patient to be able to perform functional overhead activities without compensation patterns or scapular winging in order to ensure good shoulder mechanics     Baseline  6/18- ongoing     Time  8    Period  Weeks    Status  On-going      PT LONG TERM GOAL #3   Title  Patient to be able to perform all self-care activities  such as dressing/bathing without pain or ROM limitations in order to improve QOL     Baseline  6/18- ongoing, but improving     Time  8    Period  Weeks    Status  On-going      PT LONG TERM GOAL #4   Title  Patient to be able to perform all functional daily tasks and activities without pain in order to improve QOL and demonstrate restored  shoulder function     Baseline  6/18- continues to have pain with use of arm             Plan - 08/02/17 1249    Clinical Impression Statement  Re-assessment performed today. Patient appears to be progressing well with skilled PT services, and shows improvement in strength, posture, AROM, and PROM at this time. He does however continue to demonstrate significant functional impairments in general ROM, scapular stability and strength, and in functional use of his R UE without significant compensation patterns. He will benefit from continuation of skilled PT services to continue addressing functional deficits moving forward.     Rehab Potential  Good    Clinical Impairments Affecting Rehab Potential  (+) appears motivated to participate, success with PT in the past; (-) poor awareness of safety and compensations present     PT Frequency  2x / week    PT Duration  4 weeks    PT Treatment/Interventions  ADLs/Self Care Home Management;Biofeedback;Cryotherapy;Electrical Stimulation;Iontophoresis 4mg /ml Dexamethasone;Moist Heat;Ultrasound;Functional mobility training;Therapeutic activities;Therapeutic exercise;Balance training;Neuromuscular re-education;Patient/family education;Manual techniques;Scar mobilization;Passive range of motion;Dry needling;Taping    PT Next Visit Plan  continue working on ROM primarily, STM, postural training. Introduced Radio broadcast assistant for [employer]. Continue taping.     PT Home Exercise Plan  Eval: supine flexion AAROM, supine ABD AAROM, standing ER doorway PROM stretch, isometrics, wall walks, seated ABD stretch     Consulted and Agree with Plan of Care  Patient       Patient will benefit from skilled therapeutic intervention in order to improve the following deficits and impairments:  Increased fascial restricitons, Improper body mechanics, Pain, Decreased coordination, Decreased mobility, Decreased scar mobility, Increased muscle spasms, Postural dysfunction, Decreased range  of motion, Decreased strength, Hypomobility, Impaired UE functional use, Impaired flexibility  Visit Diagnosis: Stiffness of right shoulder, not elsewhere classified  Chronic right shoulder pain  Muscle weakness (generalized)  Abnormal posture     Problem List Patient Active Problem List   Diagnosis Date Noted  . Status post arthroscopy of right shoulder 07/04/2017  . Impingement syndrome of right shoulder 10/04/2016  . Pulmonary sarcoidosis ([place]) 11/28/2015  . Cough 11/12/2015    Deniece Ree PT, DPT, CBIS  Supplemental Physical Therapist [place]   Pager [place] [facility] [address] [place], [place], [address] Phone: [phone]   Fax:  [phone]  Name: AIKEN WITHEM MRN: [id number] Date of Birth: 12/21/1955

## 2017-08-04 ENCOUNTER — Ambulatory Visit: Payer: 59 | Admitting: Physical Therapy

## 2017-08-04 ENCOUNTER — Encounter: Payer: Self-pay | Admitting: Physical Therapy

## 2017-08-04 DIAGNOSIS — R293 Abnormal posture: Secondary | ICD-10-CM

## 2017-08-04 DIAGNOSIS — M25511 Pain in right shoulder: Secondary | ICD-10-CM

## 2017-08-04 DIAGNOSIS — M6281 Muscle weakness (generalized): Secondary | ICD-10-CM

## 2017-08-04 DIAGNOSIS — G8929 Other chronic pain: Secondary | ICD-10-CM

## 2017-08-04 DIAGNOSIS — M25611 Stiffness of right shoulder, not elsewhere classified: Secondary | ICD-10-CM | POA: Diagnosis not present

## 2017-08-04 NOTE — Patient Instructions (Signed)
SHOULDER: Posterior Capsule    Place hand over elbow. Stretch arm across body. Hold _10__ seconds. _10__ reps per set, _3__ sets per day, _7Anterior Capsule Sleeper Stretch, Side-Lying    Lie on side, pillow under head, neck in neutral, underside arm in 90-90 of shoulder and elbow flexion with scapula fixed to table. Use other hand to press palm of underside arm forward and downward. Keep elbow angle. Hold _10__ seconds.  Repeat _10__ times per session. Do  1- _3__ sessions per day.   Copyright  VHI. All rights reserved.  Posterior Capsule Sleeper Stretch, Side-Lying    Lie on side, pillow under head, neck in neutral, underside arm in 90-90 of shoulder and elbow flexion with scapula fixed to table. Use other hand to press back of underside arm forward and downward. Keep elbow angle. Hold __10_ seconds.  Repeat _10__ times per session. Do 1 -_3__ sessions per day. Advanced: Use PNF contract-relax method.  Copyright  VHI. All rights reserved.

## 2017-08-04 NOTE — Therapy (Signed)
[place], [place], [address] Phone: [phone]   Fax:  [phone]  Physical Therapy Treatment  Patient Details  Name: Robert Morrison MRN: [id number] Date of Birth: [birth year] Referring Provider: Jean Rosenthal    Encounter Date: 08/04/2017  PT End of Session - 08/04/17 1144    Visit Number  8    Number of Visits  17    Date for PT Re-Evaluation  09/06/17    PT Start Time  1050    PT Stop Time  1155    PT Time Calculation (min)  65 min    Activity Tolerance  Patient tolerated treatment well    Behavior During Therapy  [facility] for tasks assessed/performed       Past Medical History:  Diagnosis Date  . Allergy    takes Claritin daily  . GERD (gastroesophageal reflux disease)    takes OMeprazole daily  . History of bronchitis    around 08  . History of colon polyps    benign  . Hyperlipidemia   . Pneumonia    hx of couple of yrs ago  . PONV (postoperative nausea and vomiting)     Past Surgical History:  Procedure Laterality Date  . COLONOSCOPY  12-05-2009  . COLONOSCOPY    . ELBOW FRACTURE SURGERY Left 1998  . ENDOBRONCHIAL ULTRASOUND Bilateral 12/08/2015   Procedure: ENDOBRONCHIAL ULTRASOUND;  Surgeon: Juanito Doom, MD;  Location: [facility];  Service: Cardiopulmonary;  Laterality: Bilateral;  . MEDIASTINOSCOPY N/A 01/29/2016   Procedure: MEDIASTINOSCOPY;  Surgeon: Melrose Nakayama, MD;  Location: [facility];  Service: Thoracic;  Laterality: N/A;  . POLYPECTOMY    . ROTATOR CUFF REPAIR Left 2010    There were no vitals filed for this visit.  Subjective Assessment - 08/04/17 1052    Subjective  I like to do the bands at home.  I am ready for the green.  I am back to the bed lately.  Meds as needed over the counter.  fewer lately.      Currently in Pain?  No/denies    Pain Location  Shoulder    Pain Orientation  Right    Pain Descriptors / Indicators  Nagging    Pain Type   Surgical pain    Pain Frequency  Intermittent    Aggravating Factors   sleeping on shoulder,  end range stretching    Pain Relieving Factors  over the counter,   rest         [facility] PT Assessment - 08/04/17 0001      PROM   Right Shoulder Flexion  140 Degrees    Right Shoulder ABduction  120 Degrees    Right Shoulder Internal Rotation  50 Degrees    Right Shoulder External Rotation  59 Degrees                   [facility] Adult PT Treatment/Exercise - 08/04/17 0001      Shoulder Exercises: Pulleys   Flexion  3 minutes    Scaption  3 minutes      Shoulder Exercises: Stretch   Other Shoulder Stretches  sleeper stretch ,  posterior and anterior capsule stretch  added to HEP  10 x 10 seconds      Cryotherapy   Number Minutes Cryotherapy  10 Minutes    Cryotherapy Location  Shoulder    Type of Cryotherapy  -- cold pack      Manual Therapy  Passive ROM  PROM all planes             PT Education - 08/04/17 1143    Education provided  Yes    Education Details  HEP    Person(s) Educated  Patient    Methods  Explanation;Demonstration;Tactile cues;Verbal cues;Handout    Comprehension  Verbalized understanding;Returned demonstration       PT Short Term Goals - 08/02/17 1116      PT SHORT TERM GOAL #1   Title  Patient to demonstrate R shoulder AAROM and AROM as being full and pain free in order to improve gross shoulder mobility and mechanics     Baseline  6/18- improving, see flowsheets    Time  4    Period  Weeks    Status  On-going      PT SHORT TERM GOAL #2   Title  Patient to demonstrate resolutation of postural deficits in order to support healthy shoulder mechanics and reduce compensation patterns     Baseline  6/18- improving, more aware     Time  4    Period  Weeks    Status  Achieved      PT SHORT TERM GOAL #3   Title  Patient to report pain as being 0/10 in R shoulder with all functional tasks and activities in order to improve QOL and  functional task tolerance     Baseline  6/18- 0/10 at rest but can hurt a little when moving     Time  4    Period  Weeks    Status  On-going      PT SHORT TERM GOAL #4   Title  Patient to be independent in correct performance of appropriate HEP, to be updated PRN     Baseline  6/18- "i need to do more sets" but is compliant     Time  1    Status  Achieved        PT Long Term Goals - 08/02/17 1120      PT LONG TERM GOAL #1   Title  Patient to demonstrate functional strength of 5/5 in all tested muscles in order to show improved strength and stability of R shoulder     Baseline  6/18- flowsheets     Time  8    Period  Weeks    Status  On-going      PT LONG TERM GOAL #2   Title  Patient to be able to perform functional overhead activities without compensation patterns or scapular winging in order to ensure good shoulder mechanics     Baseline  6/18- ongoing     Time  8    Period  Weeks    Status  On-going      PT LONG TERM GOAL #3   Title  Patient to be able to perform all self-care activities such as dressing/bathing without pain or ROM limitations in order to improve QOL     Baseline  6/18- ongoing, but improving     Time  8    Period  Weeks    Status  On-going      PT LONG TERM GOAL #4   Title  Patient to be able to perform all functional daily tasks and activities without pain in order to improve QOL and demonstrate restored shoulder function     Baseline  6/18- continues to have pain with use of arm  Plan - 08/04/17 1146    Clinical Impression Statement  ROM gradually improving.  See flow sheet.  No pain at end of session.  Progressed the HEP for stretching.      PT Next Visit Plan  continue working on ROM primarily, STM, postural training. Introduced Radio broadcast assistant for [employer]. Continue taping.     PT Home Exercise Plan  Eval: supine flexion AAROM, supine ABD AAROM, standing ER doorway PROM stretch, isometrics, wall walks, seated ABD stretch sleeper  stretches X2, and posterior capsuel stretch    Consulted and Agree with Plan of Care  Patient       Patient will benefit from skilled therapeutic intervention in order to improve the following deficits and impairments:     Visit Diagnosis: Stiffness of right shoulder, not elsewhere classified  Chronic right shoulder pain  Muscle weakness (generalized)  Abnormal posture     Problem List Patient Active Problem List   Diagnosis Date Noted  . Status post arthroscopy of right shoulder 07/04/2017  . Impingement syndrome of right shoulder 10/04/2016  . Pulmonary sarcoidosis ([place]) 11/28/2015  . Cough 11/12/2015    Marcea Rojek PTA 08/04/2017, 11:49 AM  [facility] [address] [place], [place], [address] Phone: [phone]   Fax:  [phone]  Name: Robert Morrison MRN: [id number] Date of Birth: 08-Feb-1956

## 2017-08-08 NOTE — Therapy (Signed)
[place], [place], [address] Phone: [phone]   Fax:  [phone]  Physical Therapy Treatment  Patient Details  Name: Robert Morrison MRN: [id number] Date of Birth: 1955/11/04 Referring Provider: Jean Rosenthal    Encounter Date: 08/04/2017    Past Medical History:  Diagnosis Date  . Allergy    takes Claritin daily  . GERD (gastroesophageal reflux disease)    takes OMeprazole daily  . History of bronchitis    around 08  . History of colon polyps    benign  . Hyperlipidemia   . Pneumonia    hx of couple of yrs ago  . PONV (postoperative nausea and vomiting)     Past Surgical History:  Procedure Laterality Date  . COLONOSCOPY  12-05-2009  . COLONOSCOPY    . ELBOW FRACTURE SURGERY Left 1998  . ENDOBRONCHIAL ULTRASOUND Bilateral 12/08/2015   Procedure: ENDOBRONCHIAL ULTRASOUND;  Surgeon: Juanito Doom, MD;  Location: [facility];  Service: Cardiopulmonary;  Laterality: Bilateral;  . MEDIASTINOSCOPY N/A 01/29/2016   Procedure: MEDIASTINOSCOPY;  Surgeon: Melrose Nakayama, MD;  Location: [facility];  Service: Thoracic;  Laterality: N/A;  . POLYPECTOMY    . ROTATOR CUFF REPAIR Left 2010    There were no vitals filed for this visit.                              PT Short Term Goals - 08/02/17 1116      PT SHORT TERM GOAL #1   Title  Patient to demonstrate R shoulder AAROM and AROM as being full and pain free in order to improve gross shoulder mobility and mechanics     Baseline  6/18- improving, see flowsheets    Time  4    Period  Weeks    Status  On-going      PT SHORT TERM GOAL #2   Title  Patient to demonstrate resolutation of postural deficits in order to support healthy shoulder mechanics and reduce compensation patterns     Baseline  6/18- improving, more aware     Time  4    Period  Weeks    Status  Achieved      PT SHORT TERM GOAL #3   Title  Patient to report pain as being 0/10 in R shoulder with all functional tasks and activities in order to improve QOL and functional task tolerance     Baseline  6/18- 0/10 at rest but can hurt a little when moving     Time  4    Period  Weeks    Status  On-going      PT SHORT TERM GOAL #4   Title  Patient to be independent in correct performance of appropriate HEP, to be updated PRN     Baseline  6/18- "i need to do more sets" but is compliant     Time  1    Status  Achieved        PT Long Term Goals - 08/02/17 1120      PT LONG TERM GOAL #1   Title  Patient to demonstrate functional strength of 5/5 in all tested muscles in order to show improved strength and stability of R shoulder     Baseline  6/18- flowsheets     Time  8    Period  Weeks    Status  On-going  PT LONG TERM GOAL #2   Title  Patient to be able to perform functional overhead activities without compensation patterns or scapular winging in order to ensure good shoulder mechanics     Baseline  6/18- ongoing     Time  8    Period  Weeks    Status  On-going      PT LONG TERM GOAL #3   Title  Patient to be able to perform all self-care activities such as dressing/bathing without pain or ROM limitations in order to improve QOL     Baseline  6/18- ongoing, but improving     Time  8    Period  Weeks    Status  On-going      PT LONG TERM GOAL #4   Title  Patient to be able to perform all functional daily tasks and activities without pain in order to improve QOL and demonstrate restored shoulder function     Baseline  6/18- continues to have pain with use of arm               Patient will benefit from skilled therapeutic intervention in order to improve the following deficits and impairments:     Visit Diagnosis: Stiffness of right shoulder, not elsewhere classified  Chronic right shoulder pain  Muscle weakness (generalized)  Abnormal posture     Problem List Patient Active Problem List    Diagnosis Date Noted  . Status post arthroscopy of right shoulder 07/04/2017  . Impingement syndrome of right shoulder 10/04/2016  . Pulmonary sarcoidosis ([place]) 11/28/2015  . Cough 11/12/2015    HARRIS,KAREN PTA 08/08/2017, 7:41 AM  [facility] [address] [place], [place], [address] Phone: [phone]   Fax:  [phone]  Name: Robert Morrison MRN: [id number] Date of Birth: 12-Nov-1955

## 2017-08-09 ENCOUNTER — Encounter: Payer: Self-pay | Admitting: Physical Therapy

## 2017-08-09 ENCOUNTER — Ambulatory Visit: Payer: 59 | Admitting: Physical Therapy

## 2017-08-09 DIAGNOSIS — M6281 Muscle weakness (generalized): Secondary | ICD-10-CM

## 2017-08-09 DIAGNOSIS — M25611 Stiffness of right shoulder, not elsewhere classified: Secondary | ICD-10-CM | POA: Diagnosis not present

## 2017-08-09 DIAGNOSIS — G8929 Other chronic pain: Secondary | ICD-10-CM

## 2017-08-09 DIAGNOSIS — R293 Abnormal posture: Secondary | ICD-10-CM

## 2017-08-09 DIAGNOSIS — M25511 Pain in right shoulder: Secondary | ICD-10-CM

## 2017-08-09 NOTE — Therapy (Signed)
[place], [place], [address] Phone: [phone]   Fax:  [phone]  Physical Therapy Treatment  Patient Details  Name: Robert Morrison MRN: [id number] Date of Birth: 1955/09/04 Referring Provider: Jean Rosenthal    Encounter Date: 08/09/2017  PT End of Session - 08/09/17 1140    Visit Number  9    Number of Visits  17    Date for PT Re-Evaluation  09/06/17    Authorization Type  [employer]     Authorization Time Period  07/07/17 to 09/06/17    PT Start Time  1101    PT Stop Time  1140    PT Time Calculation (min)  39 min    Activity Tolerance  Patient tolerated treatment well    Behavior During Therapy  [facility] for tasks assessed/performed       Past Medical History:  Diagnosis Date  . Allergy    takes Claritin daily  . GERD (gastroesophageal reflux disease)    takes OMeprazole daily  . History of bronchitis    around 08  . History of colon polyps    benign  . Hyperlipidemia   . Pneumonia    hx of couple of yrs ago  . PONV (postoperative nausea and vomiting)     Past Surgical History:  Procedure Laterality Date  . COLONOSCOPY  12-05-2009  . COLONOSCOPY    . ELBOW FRACTURE SURGERY Left 1998  . ENDOBRONCHIAL ULTRASOUND Bilateral 12/08/2015   Procedure: ENDOBRONCHIAL ULTRASOUND;  Surgeon: Juanito Doom, MD;  Location: [facility];  Service: Cardiopulmonary;  Laterality: Bilateral;  . MEDIASTINOSCOPY N/A 01/29/2016   Procedure: MEDIASTINOSCOPY;  Surgeon: Melrose Nakayama, MD;  Location: [facility];  Service: Thoracic;  Laterality: N/A;  . POLYPECTOMY    . ROTATOR CUFF REPAIR Left 2010    There were no vitals filed for this visit.  Subjective Assessment - 08/09/17 1103    Subjective  I felt good after last sesssion, my shoulder is still feeling stiff though.     Patient Stated Goals  get my right shoulder as good as my left shoulder     Currently in Pain?  No/denies                        [facility] Adult PT Treatment/Exercise - 08/09/17 0001      Shoulder Exercises: Supine   Other Supine Exercises  R shoulder flexion/ABD/ER/IR stretches       Shoulder Exercises: Prone   Other Prone Exercises  blackburn 6 1x10 as tolerated  skipped 4 and 6 due to inability to lift R shoulder       Shoulder Exercises: Standing   Extension  Both;15 reps;Theraband    Theraband Level (Shoulder Extension)  Level 3 (Green)    Retraction  Both;15 reps;Theraband    Theraband Level (Shoulder Retraction)  Level 3 (Green)    Other Standing Exercises  wall walks 10x10 second holds; wall ball CW/CCW circles x15 each     Other Standing Exercises  wall scaption wall slides 10x10second holds; wall pushpus 1x15, diamond pushups 1x10      Manual Therapy   Manual Therapy  Joint mobilization    Manual therapy comments  separate from all other skilled services     Joint Mobilization  grade III GH inferior mobilization              PT Education - 08/09/17 1140    Education provided  Yes    Education Details  purpose of all exercises during session, exercise form     Person(s) Educated  Patient    Methods  Explanation    Comprehension  Verbalized understanding       PT Short Term Goals - 08/02/17 1116      PT SHORT TERM GOAL #1   Title  Patient to demonstrate R shoulder AAROM and AROM as being full and pain free in order to improve gross shoulder mobility and mechanics     Baseline  6/18- improving, see flowsheets    Time  4    Period  Weeks    Status  On-going      PT SHORT TERM GOAL #2   Title  Patient to demonstrate resolutation of postural deficits in order to support healthy shoulder mechanics and reduce compensation patterns     Baseline  6/18- improving, more aware     Time  4    Period  Weeks    Status  Achieved      PT SHORT TERM GOAL #3   Title  Patient to report pain as being 0/10 in R shoulder with all functional tasks and activities in order  to improve QOL and functional task tolerance     Baseline  6/18- 0/10 at rest but can hurt a little when moving     Time  4    Period  Weeks    Status  On-going      PT SHORT TERM GOAL #4   Title  Patient to be independent in correct performance of appropriate HEP, to be updated PRN     Baseline  6/18- "i need to do more sets" but is compliant     Time  1    Status  Achieved        PT Long Term Goals - 08/02/17 1120      PT LONG TERM GOAL #1   Title  Patient to demonstrate functional strength of 5/5 in all tested muscles in order to show improved strength and stability of R shoulder     Baseline  6/18- flowsheets     Time  8    Period  Weeks    Status  On-going      PT LONG TERM GOAL #2   Title  Patient to be able to perform functional overhead activities without compensation patterns or scapular winging in order to ensure good shoulder mechanics     Baseline  6/18- ongoing     Time  8    Period  Weeks    Status  On-going      PT LONG TERM GOAL #3   Title  Patient to be able to perform all self-care activities such as dressing/bathing without pain or ROM limitations in order to improve QOL     Baseline  6/18- ongoing, but improving     Time  8    Period  Weeks    Status  On-going      PT LONG TERM GOAL #4   Title  Patient to be able to perform all functional daily tasks and activities without pain in order to improve QOL and demonstrate restored shoulder function     Baseline  6/18- continues to have pain with use of arm             Plan - 08/09/17 1141    Clinical Impression Statement  Continued with functional stretches for shoulder ROM today, then continued with  shoulder strengthening and other ROM based exercises as tolerated and appropriate today. Patient continues to progress well with skilled PT services but will continue to benefit from ongoing skilled intervention.     Rehab Potential  Good    Clinical Impairments Affecting Rehab Potential  (+) appears  motivated to participate, success with PT in the past; (-) poor awareness of safety and compensations present     PT Frequency  2x / week    PT Duration  4 weeks    PT Treatment/Interventions  ADLs/Self Care Home Management;Biofeedback;Cryotherapy;Electrical Stimulation;Iontophoresis 4mg /ml Dexamethasone;Moist Heat;Ultrasound;Functional mobility training;Therapeutic activities;Therapeutic exercise;Balance training;Neuromuscular re-education;Patient/family education;Manual techniques;Scar mobilization;Passive range of motion;Dry needling;Taping    PT Next Visit Plan  continue working on ROM primarily, STM, postural training. Introduced Radio broadcast assistant for [employer]. Continue taping.     PT Home Exercise Plan  Eval: supine flexion AAROM, supine ABD AAROM, standing ER doorway PROM stretch, isometrics, wall walks, seated ABD stretch sleeper stretches X2, and posterior capsuel stretch    Consulted and Agree with Plan of Care  Patient       Patient will benefit from skilled therapeutic intervention in order to improve the following deficits and impairments:  Increased fascial restricitons, Improper body mechanics, Pain, Decreased coordination, Decreased mobility, Decreased scar mobility, Increased muscle spasms, Postural dysfunction, Decreased range of motion, Decreased strength, Hypomobility, Impaired UE functional use, Impaired flexibility  Visit Diagnosis: Stiffness of right shoulder, not elsewhere classified  Chronic right shoulder pain  Abnormal posture  Muscle weakness (generalized)     Problem List Patient Active Problem List   Diagnosis Date Noted  . Status post arthroscopy of right shoulder 07/04/2017  . Impingement syndrome of right shoulder 10/04/2016  . Pulmonary sarcoidosis ([place]) 11/28/2015  . Cough 11/12/2015    Deniece Ree PT, DPT, CBIS  Supplemental Physical Therapist [place]   Pager [facility] [address] [place], [place], [address] Phone: [phone]   Fax:  [phone]  Name: Robert Morrison MRN: [id number] Date of Birth: 30-Nov-1955

## 2017-08-11 ENCOUNTER — Encounter: Payer: Self-pay | Admitting: Physical Therapy

## 2017-08-11 ENCOUNTER — Ambulatory Visit: Payer: 59 | Admitting: Physical Therapy

## 2017-08-11 DIAGNOSIS — M6281 Muscle weakness (generalized): Secondary | ICD-10-CM

## 2017-08-11 DIAGNOSIS — M25611 Stiffness of right shoulder, not elsewhere classified: Secondary | ICD-10-CM | POA: Diagnosis not present

## 2017-08-11 DIAGNOSIS — R293 Abnormal posture: Secondary | ICD-10-CM

## 2017-08-11 DIAGNOSIS — G8929 Other chronic pain: Secondary | ICD-10-CM

## 2017-08-11 DIAGNOSIS — M25511 Pain in right shoulder: Secondary | ICD-10-CM

## 2017-08-11 NOTE — Therapy (Signed)
[place], [place], [address] Phone: [phone]   Fax:  [phone]  Physical Therapy Treatment  Patient Details  Name: Robert Morrison MRN: [id number] Date of Birth: 1956-01-16 Referring Provider: Jean Rosenthal    Encounter Date: 08/11/2017  PT End of Session - 08/11/17 1056    Visit Number  10    Number of Visits  17    Date for PT Re-Evaluation  09/06/17    Authorization Type  [employer]     Authorization Time Period  07/07/17 to 09/06/17    PT Start Time  1016    PT Stop Time  1055    PT Time Calculation (min)  39 min    Activity Tolerance  Patient tolerated treatment well    Behavior During Therapy  [facility] for tasks assessed/performed       Past Medical History:  Diagnosis Date  . Allergy    takes Claritin daily  . GERD (gastroesophageal reflux disease)    takes OMeprazole daily  . History of bronchitis    around 08  . History of colon polyps    benign  . Hyperlipidemia   . Pneumonia    hx of couple of yrs ago  . PONV (postoperative nausea and vomiting)     Past Surgical History:  Procedure Laterality Date  . COLONOSCOPY  12-05-2009  . COLONOSCOPY    . ELBOW FRACTURE SURGERY Left 1998  . ENDOBRONCHIAL ULTRASOUND Bilateral 12/08/2015   Procedure: ENDOBRONCHIAL ULTRASOUND;  Surgeon: Juanito Doom, MD;  Location: [facility];  Service: Cardiopulmonary;  Laterality: Bilateral;  . MEDIASTINOSCOPY N/A 01/29/2016   Procedure: MEDIASTINOSCOPY;  Surgeon: Melrose Nakayama, MD;  Location: [facility];  Service: Thoracic;  Laterality: N/A;  . POLYPECTOMY    . ROTATOR CUFF REPAIR Left 2010    There were no vitals filed for this visit.  Subjective Assessment - 08/11/17 1018    Subjective  I was busy yesterday, I painted my deck and spent a lot of the day hauling wood and my shoulder really got fatigued. It is feeling OK today.     Patient Stated Goals  get my right shoulder as  good as my left shoulder     Currently in Pain?  No/denies                       [facility] Adult PT Treatment/Exercise - 08/11/17 0001      Shoulder Exercises: Supine   Flexion  AROM;15 reps;Other (comment) 5 second holds     ABduction  AROM;15 reps;Other (comment) 5 second holds     Other Supine Exercises  R shoulder flexion/ABD/ER/IR stretches       Shoulder Exercises: Standing   Retraction  Both;15 reps;Theraband    Theraband Level (Shoulder Retraction)  Level 3 (Green)    Other Standing Exercises  wall pushup 1x10, diamond pushup 1x10       Shoulder Exercises: Stretch   External Rotation Stretch  10 seconds x15 reps at doorframe       Manual Therapy   Manual Therapy  Joint mobilization    Manual therapy comments  separate from all other skilled services     Joint Mobilization  grade III GH inferior mobilization              PT Education - 08/11/17 1055    Education provided  Yes    Education Details  progression to active ROM today, POC  moving forward, exercise form     Person(s) Educated  Patient    Methods  Explanation    Comprehension  Verbalized understanding       PT Short Term Goals - 08/02/17 1116      PT SHORT TERM GOAL #1   Title  Patient to demonstrate R shoulder AAROM and AROM as being full and pain free in order to improve gross shoulder mobility and mechanics     Baseline  6/18- improving, see flowsheets    Time  4    Period  Weeks    Status  On-going      PT SHORT TERM GOAL #2   Title  Patient to demonstrate resolutation of postural deficits in order to support healthy shoulder mechanics and reduce compensation patterns     Baseline  6/18- improving, more aware     Time  4    Period  Weeks    Status  Achieved      PT SHORT TERM GOAL #3   Title  Patient to report pain as being 0/10 in R shoulder with all functional tasks and activities in order to improve QOL and functional task tolerance     Baseline  6/18- 0/10 at rest but can  hurt a little when moving     Time  4    Period  Weeks    Status  On-going      PT SHORT TERM GOAL #4   Title  Patient to be independent in correct performance of appropriate HEP, to be updated PRN     Baseline  6/18- "i need to do more sets" but is compliant     Time  1    Status  Achieved        PT Long Term Goals - 08/02/17 1120      PT LONG TERM GOAL #1   Title  Patient to demonstrate functional strength of 5/5 in all tested muscles in order to show improved strength and stability of R shoulder     Baseline  6/18- flowsheets     Time  8    Period  Weeks    Status  On-going      PT LONG TERM GOAL #2   Title  Patient to be able to perform functional overhead activities without compensation patterns or scapular winging in order to ensure good shoulder mechanics     Baseline  6/18- ongoing     Time  8    Period  Weeks    Status  On-going      PT LONG TERM GOAL #3   Title  Patient to be able to perform all self-care activities such as dressing/bathing without pain or ROM limitations in order to improve QOL     Baseline  6/18- ongoing, but improving     Time  8    Period  Weeks    Status  On-going      PT LONG TERM GOAL #4   Title  Patient to be able to perform all functional daily tasks and activities without pain in order to improve QOL and demonstrate restored shoulder function     Baseline  6/18- continues to have pain with use of arm             Plan - 08/11/17 1056    Clinical Impression Statement  Continued with extensive [place] joint mobilizations and shoulder stretching today, able to reach/progress to full AROM exercises in some planes of motion  in gravity eliminated positions with right shoulder today. R shoulder as a whole continues to remain stiff however. Otherwise continued work on shoulder flexibility and scapular strengthening as tolerated and as time allowed this session.     Rehab Potential  Good    Clinical Impairments Affecting Rehab Potential  (+)  appears motivated to participate, success with PT in the past; (-) poor awareness of safety and compensations present     PT Frequency  2x / week    PT Duration  4 weeks    PT Treatment/Interventions  ADLs/Self Care Home Management;Biofeedback;Cryotherapy;Electrical Stimulation;Iontophoresis 4mg /ml Dexamethasone;Moist Heat;Ultrasound;Functional mobility training;Therapeutic activities;Therapeutic exercise;Balance training;Neuromuscular re-education;Patient/family education;Manual techniques;Scar mobilization;Passive range of motion;Dry needling;Taping    PT Next Visit Plan  continue working on ROM primarily, STM, postural training. Introduced Radio broadcast assistant for [employer]. Continue working in AROM with good form.     PT Home Exercise Plan  Eval: supine flexion AAROM, supine ABD AAROM, standing ER doorway PROM stretch, isometrics, wall walks, seated ABD stretch sleeper stretches X2, and posterior capsuel stretch    Consulted and Agree with Plan of Care  Patient       Patient will benefit from skilled therapeutic intervention in order to improve the following deficits and impairments:  Increased fascial restricitons, Improper body mechanics, Pain, Decreased coordination, Decreased mobility, Decreased scar mobility, Increased muscle spasms, Postural dysfunction, Decreased range of motion, Decreased strength, Hypomobility, Impaired UE functional use, Impaired flexibility  Visit Diagnosis: Stiffness of right shoulder, not elsewhere classified  Chronic right shoulder pain  Abnormal posture  Muscle weakness (generalized)     Problem List Patient Active Problem List   Diagnosis Date Noted  . Status post arthroscopy of right shoulder 07/04/2017  . Impingement syndrome of right shoulder 10/04/2016  . Pulmonary sarcoidosis ([place]) 11/28/2015  . Cough 11/12/2015    Deniece Ree PT, DPT, CBIS  Supplemental Physical Therapist [place]   Pager [facility] [address] [place], [place], [address] Phone: [phone]   Fax:  [phone]  Name: Robert Morrison MRN: [id number] Date of Birth: 05/17/1955

## 2017-08-15 ENCOUNTER — Ambulatory Visit (INDEPENDENT_AMBULATORY_CARE_PROVIDER_SITE_OTHER): Payer: 59 | Admitting: Orthopaedic Surgery

## 2017-08-15 ENCOUNTER — Encounter (INDEPENDENT_AMBULATORY_CARE_PROVIDER_SITE_OTHER): Payer: Self-pay | Admitting: Orthopaedic Surgery

## 2017-08-15 DIAGNOSIS — Z9889 Other specified postprocedural states: Secondary | ICD-10-CM

## 2017-08-15 NOTE — Progress Notes (Signed)
The patient is now 7 weeks status post a right shoulder arthroscopy with subacromial decompression.  He still working on range of motion with his therapist.  He has visits for at least 3 more weeks.  He does have some improvements of forward flexion as well as abduction and external rotation but he still quite stiff and I like to see him along further in terms of improving his motion.  He does not have much pain.  He will continue outpatient physical therapy and I will see him back in 4 weeks to consider whether or not we need to set him up for an intra-articular steroid injection.  However is not having pain and this is more of pushing him through range of motion of the shoulder.  Worse case scenario would be a manipulation under anesthesia and fraying of scar tissue but hopefully will not have to do that.

## 2017-08-16 ENCOUNTER — Ambulatory Visit: Payer: 59 | Attending: Orthopaedic Surgery | Admitting: Physical Therapy

## 2017-08-16 ENCOUNTER — Encounter: Payer: Self-pay | Admitting: Physical Therapy

## 2017-08-16 DIAGNOSIS — M6281 Muscle weakness (generalized): Secondary | ICD-10-CM | POA: Insufficient documentation

## 2017-08-16 DIAGNOSIS — M25511 Pain in right shoulder: Secondary | ICD-10-CM | POA: Insufficient documentation

## 2017-08-16 DIAGNOSIS — R293 Abnormal posture: Secondary | ICD-10-CM | POA: Diagnosis present

## 2017-08-16 DIAGNOSIS — G8929 Other chronic pain: Secondary | ICD-10-CM | POA: Diagnosis not present

## 2017-08-16 DIAGNOSIS — M25611 Stiffness of right shoulder, not elsewhere classified: Secondary | ICD-10-CM | POA: Diagnosis not present

## 2017-08-16 NOTE — Therapy (Signed)
Robert Morrison, [place], [address] Phone: [phone]   Fax:  [phone]  Physical Therapy Treatment  Patient Details  Name: Robert Morrison MRN: [id number] Date of Birth: [birth year] Referring Provider: Jean Rosenthal    Encounter Date: 08/16/2017  PT End of Session - 08/16/17 1141    Visit Number  11    Number of Visits  17    Date for PT Re-Evaluation  08/30/17    Authorization Type  [employer]     Authorization Time Period  07/07/17 to 09/06/17    PT Start Time  1101    PT Stop Time  1141    PT Time Calculation (min)  40 min    Activity Tolerance  Patient tolerated treatment well    Behavior During Therapy  [facility] for tasks assessed/performed       Past Medical History:  Diagnosis Date  . Allergy    takes Claritin daily  . GERD (gastroesophageal reflux disease)    takes OMeprazole daily  . History of bronchitis    around 08  . History of colon polyps    benign  . Hyperlipidemia   . Pneumonia    hx of couple of yrs ago  . PONV (postoperative nausea and vomiting)     Past Surgical History:  Procedure Laterality Date  . COLONOSCOPY  12-05-2009  . COLONOSCOPY    . ELBOW FRACTURE SURGERY Left 1998  . ENDOBRONCHIAL ULTRASOUND Bilateral 12/08/2015   Procedure: ENDOBRONCHIAL ULTRASOUND;  Surgeon: Juanito Doom, MD;  Location: [facility];  Service: Cardiopulmonary;  Laterality: Bilateral;  . MEDIASTINOSCOPY N/A 01/29/2016   Procedure: MEDIASTINOSCOPY;  Surgeon: Melrose Nakayama, MD;  Location: [place];  Service: Thoracic;  Laterality: N/A;  . POLYPECTOMY    . ROTATOR CUFF REPAIR Left 2010    There were no vitals filed for this visit.  Subjective Assessment - 08/16/17 1102    Subjective  After we did the pushpus on the wall I had a twinge in my shoulder but it went away; I saw the MD yesterday and he is a little disappointed that the shoulder is still this stiff. My MD wants to  wait until I'm done with PT and then may talk about x-ray guided shot into this shoulder. Nothing else new.      Patient Stated Goals  get my right shoulder as good as my left shoulder     Currently in Pain?  No/denies                       [facility] Adult PT Treatment/Exercise - 08/16/17 0001      Shoulder Exercises: Supine   Flexion  AAROM;Right;20 reps;AROM;10 reps    ABduction  AAROM;Right;20 reps;AROM;10 reps    Other Supine Exercises  R shoulder flexion, ABD, ER/IR stretches 1x15       Shoulder Exercises: Seated   External Rotation  AAROM;20 reps;Other (comment) with rod, cues for form              PT Education - 08/16/17 1141    Education provided  Yes    Education Details  exercise form today, POC moving forward     Person(s) Educated  Patient    Methods  Explanation    Comprehension  Verbalized understanding       PT Short Term Goals - 08/02/17 1116      PT SHORT TERM GOAL #1   Title  Patient  to demonstrate R shoulder AAROM and AROM as being full and pain free in order to improve gross shoulder mobility and mechanics     Baseline  6/18- improving, see flowsheets    Time  4    Period  Weeks    Status  On-going      PT SHORT TERM GOAL #2   Title  Patient to demonstrate resolutation of postural deficits in order to support healthy shoulder mechanics and reduce compensation patterns     Baseline  6/18- improving, more aware     Time  4    Period  Weeks    Status  Achieved      PT SHORT TERM GOAL #3   Title  Patient to report pain as being 0/10 in R shoulder with all functional tasks and activities in order to improve QOL and functional task tolerance     Baseline  6/18- 0/10 at rest but can hurt a little when moving     Time  4    Period  Weeks    Status  On-going      PT SHORT TERM GOAL #4   Title  Patient to be independent in correct performance of appropriate HEP, to be updated PRN     Baseline  6/18- "i need to do more sets" but is  compliant     Time  1    Status  Achieved        PT Long Term Goals - 08/02/17 1120      PT LONG TERM GOAL #1   Title  Patient to demonstrate functional strength of 5/5 in all tested muscles in order to show improved strength and stability of R shoulder     Baseline  6/18- flowsheets     Time  8    Period  Weeks    Status  On-going      PT LONG TERM GOAL #2   Title  Patient to be able to perform functional overhead activities without compensation patterns or scapular winging in order to ensure good shoulder mechanics     Baseline  6/18- ongoing     Time  8    Period  Weeks    Status  On-going      PT LONG TERM GOAL #3   Title  Patient to be able to perform all self-care activities such as dressing/bathing without pain or ROM limitations in order to improve QOL     Baseline  6/18- ongoing, but improving     Time  8    Period  Weeks    Status  On-going      PT LONG TERM GOAL #4   Title  Patient to be able to perform all functional daily tasks and activities without pain in order to improve QOL and demonstrate restored shoulder function     Baseline  6/18- continues to have pain with use of arm             Plan - 08/16/17 1142    Clinical Impression Statement  Patient arrives with increased shoulder stiffness today, continued passive R shoulder stretching followed immediately by AAROM and AROM based activities this session. Discussed general progress with R shoulder as well as general POC moving forward including upcoming re-assessment and possible extension of PT based on progress at that time. Able to progress back to AROM based activities in supine at EOS.     Rehab Potential  Good    Clinical Impairments Affecting  Rehab Potential  (+) appears motivated to participate, success with PT in the past; (-) poor awareness of safety and compensations present     PT Frequency  2x / week    PT Duration  4 weeks    PT Treatment/Interventions  ADLs/Self Care Home  Management;Biofeedback;Cryotherapy;Electrical Stimulation;Iontophoresis 4mg /ml Dexamethasone;Moist Heat;Ultrasound;Functional mobility training;Therapeutic activities;Therapeutic exercise;Balance training;Neuromuscular re-education;Patient/family education;Manual techniques;Scar mobilization;Passive range of motion;Dry needling;Taping    PT Next Visit Plan  continue working on ROM primarily, STM, postural training. Introduced Radio broadcast assistant for [employer]. Continue working in AROM with good form.     PT Home Exercise Plan  Eval: supine flexion AAROM, supine ABD AAROM, standing ER doorway PROM stretch, isometrics, wall walks, seated ABD stretch sleeper stretches X2, and posterior capsuel stretch    Consulted and Agree with Plan of Care  Patient       Patient will benefit from skilled therapeutic intervention in order to improve the following deficits and impairments:  Increased fascial restricitons, Improper body mechanics, Pain, Decreased coordination, Decreased mobility, Decreased scar mobility, Increased muscle spasms, Postural dysfunction, Decreased range of motion, Decreased strength, Hypomobility, Impaired UE functional use, Impaired flexibility  Visit Diagnosis: Stiffness of right shoulder, not elsewhere classified  Chronic right shoulder pain  Abnormal posture  Muscle weakness (generalized)     Problem List Patient Active Problem List   Diagnosis Date Noted  . Status post arthroscopy of right shoulder 07/04/2017  . Impingement syndrome of right shoulder 10/04/2016  . Pulmonary sarcoidosis ([place]) 11/28/2015  . Cough 11/12/2015    Deniece Ree PT, DPT, CBIS  Supplemental Physical Therapist [place]   Pager [facility] [address] [place], [place], [address] Phone: [phone]   Fax:  [phone]  Name: BASCOM BIEL MRN: [id number] Date of Birth: July 31, 1955

## 2017-08-19 ENCOUNTER — Ambulatory Visit: Payer: 59 | Admitting: Physical Therapy

## 2017-08-19 ENCOUNTER — Encounter: Payer: Self-pay | Admitting: Physical Therapy

## 2017-08-19 DIAGNOSIS — M25511 Pain in right shoulder: Secondary | ICD-10-CM

## 2017-08-19 DIAGNOSIS — G8929 Other chronic pain: Secondary | ICD-10-CM

## 2017-08-19 DIAGNOSIS — R293 Abnormal posture: Secondary | ICD-10-CM

## 2017-08-19 DIAGNOSIS — M6281 Muscle weakness (generalized): Secondary | ICD-10-CM

## 2017-08-19 DIAGNOSIS — M25611 Stiffness of right shoulder, not elsewhere classified: Secondary | ICD-10-CM

## 2017-08-19 NOTE — Therapy (Signed)
[place], [place], [address] Phone: [phone]   Fax:  [phone]  Physical Therapy Treatment  Patient Details  Name: Robert Morrison MRN: [id number] Date of Birth: 06/12/1955 Referring Provider: Jean Rosenthal    Encounter Date: 08/19/2017  PT End of Session - 08/19/17 1057    Visit Number  12    Number of Visits  17    Date for PT Re-Evaluation  08/30/17    Authorization Type  [employer]     Authorization Time Period  07/07/17 to 09/06/17    PT Start Time  1005    PT Stop Time  1045 moist heat not included in billing    PT Time Calculation (min)  40 min    Activity Tolerance  Patient tolerated treatment well    Behavior During Therapy  [facility] for tasks assessed/performed       Past Medical History:  Diagnosis Date  . Allergy    takes Claritin daily  . GERD (gastroesophageal reflux disease)    takes OMeprazole daily  . History of bronchitis    around 08  . History of colon polyps    benign  . Hyperlipidemia   . Pneumonia    hx of couple of yrs ago  . PONV (postoperative nausea and vomiting)     Past Surgical History:  Procedure Laterality Date  . COLONOSCOPY  12-05-2009  . COLONOSCOPY    . ELBOW FRACTURE SURGERY Left 1998  . ENDOBRONCHIAL ULTRASOUND Bilateral 12/08/2015   Procedure: ENDOBRONCHIAL ULTRASOUND;  Surgeon: Juanito Doom, MD;  Location: [facility];  Service: Cardiopulmonary;  Laterality: Bilateral;  . MEDIASTINOSCOPY N/A 01/29/2016   Procedure: MEDIASTINOSCOPY;  Surgeon: Melrose Nakayama, MD;  Location: [facility];  Service: Thoracic;  Laterality: N/A;  . POLYPECTOMY    . ROTATOR CUFF REPAIR Left 2010    There were no vitals filed for this visit.  Subjective Assessment - 08/19/17 1007    Subjective  My right shoulder has still felt like its acting up since it twinged when we did the pushpus that one day. Its been aggravated when I turn the steering well to  drive and when I put on my t-shirt. I'm not sure if I should be taking alieve to help with inflammation. I'm sure my doctor will want me to get that shot under imaging.     Patient Stated Goals  get my right shoulder as good as my left shoulder     Currently in Pain?  No/denies but when I move it can get to 10/10 briefly, improves when I relax shoulder                        [facility] Adult PT Treatment/Exercise - 08/19/17 0001      Shoulder Exercises: Supine   Other Supine Exercises  supine B pec stretch on half foam roll 3x30 seconds       Shoulder Exercises: Seated   External Rotation  AAROM;Other (comment);15 reps    Flexion  AAROM;Right;15 reps    Abduction  AAROM;Right;15 reps      Shoulder Exercises: Standing   Flexion  Other (comment);10 reps wall walk, 10 second holds       Modalities   Modalities  Moist Heat      Moist Heat Therapy   Number Minutes Moist Heat  10 Minutes not included in billing     Moist Heat Location  Shoulder  Manual Therapy   Manual Therapy  Soft tissue mobilization    Manual therapy comments  separate from all other skilled services     Soft tissue mobilization  distal attachment of pec major/minor R  tennis ball              PT Education - 08/19/17 1056    Education provided  Yes    Education Details  trigger points/spasm in pectoral muscles, use of heat/gentle pec stretches to address at home, tennis ball massage at home, difference between AAROM and AROM exercises     Person(s) Educated  Patient    Methods  Explanation    Comprehension  Verbalized understanding       PT Short Term Goals - 08/02/17 1116      PT SHORT TERM GOAL #1   Title  Patient to demonstrate R shoulder AAROM and AROM as being full and pain free in order to improve gross shoulder mobility and mechanics     Baseline  6/18- improving, see flowsheets    Time  4    Period  Weeks    Status  On-going      PT SHORT TERM GOAL #2   Title  Patient to  demonstrate resolutation of postural deficits in order to support healthy shoulder mechanics and reduce compensation patterns     Baseline  6/18- improving, more aware     Time  4    Period  Weeks    Status  Achieved      PT SHORT TERM GOAL #3   Title  Patient to report pain as being 0/10 in R shoulder with all functional tasks and activities in order to improve QOL and functional task tolerance     Baseline  6/18- 0/10 at rest but can hurt a little when moving     Time  4    Period  Weeks    Status  On-going      PT SHORT TERM GOAL #4   Title  Patient to be independent in correct performance of appropriate HEP, to be updated PRN     Baseline  6/18- "i need to do more sets" but is compliant     Time  1    Status  Achieved        PT Long Term Goals - 08/02/17 1120      PT LONG TERM GOAL #1   Title  Patient to demonstrate functional strength of 5/5 in all tested muscles in order to show improved strength and stability of R shoulder     Baseline  6/18- flowsheets     Time  8    Period  Weeks    Status  On-going      PT LONG TERM GOAL #2   Title  Patient to be able to perform functional overhead activities without compensation patterns or scapular winging in order to ensure good shoulder mechanics     Baseline  6/18- ongoing     Time  8    Period  Weeks    Status  On-going      PT LONG TERM GOAL #3   Title  Patient to be able to perform all self-care activities such as dressing/bathing without pain or ROM limitations in order to improve QOL     Baseline  6/18- ongoing, but improving     Time  8    Period  Weeks    Status  On-going      PT  LONG TERM GOAL #4   Title  Patient to be able to perform all functional daily tasks and activities without pain in order to improve QOL and demonstrate restored shoulder function     Baseline  6/18- continues to have pain with use of arm             Plan - 08/19/17 1057    Clinical Impression Statement  Patient arrives  complaining of ongoing pain anterior R shoulder- "it feels like it twinged during some pushups and its not stopped"; palpation and exam reveals significant tightness and TTP in pec major/minor distal attachment point. Applied heat (not included in billing) to assist in relaxing muscle and followed up immediately with STM. Otherwise continued working on shoulder ROM techniques as able and tolerated this session.     Rehab Potential  Good    Clinical Impairments Affecting Rehab Potential  (+) appears motivated to participate, success with PT in the past; (-) poor awareness of safety and compensations present     PT Frequency  2x / week    PT Duration  4 weeks    PT Treatment/Interventions  ADLs/Self Care Home Management;Biofeedback;Cryotherapy;Electrical Stimulation;Iontophoresis 4mg /ml Dexamethasone;Moist Heat;Ultrasound;Functional mobility training;Therapeutic activities;Therapeutic exercise;Balance training;Neuromuscular re-education;Patient/family education;Manual techniques;Scar mobilization;Passive range of motion;Dry needling;Taping    PT Next Visit Plan  continue heat/STM to pec, consider DN. AAROM/AROM.     PT Home Exercise Plan  Eval: supine flexion AAROM, supine ABD AAROM, standing ER doorway PROM stretch, isometrics, wall walks, seated ABD stretch sleeper stretches X2, and posterior capsuel stretch    Consulted and Agree with Plan of Care  Patient       Patient will benefit from skilled therapeutic intervention in order to improve the following deficits and impairments:  Increased fascial restricitons, Improper body mechanics, Pain, Decreased coordination, Decreased mobility, Decreased scar mobility, Increased muscle spasms, Postural dysfunction, Decreased range of motion, Decreased strength, Hypomobility, Impaired UE functional use, Impaired flexibility  Visit Diagnosis: Stiffness of right shoulder, not elsewhere classified  Chronic right shoulder pain  Abnormal posture  Muscle  weakness (generalized)     Problem List Patient Active Problem List   Diagnosis Date Noted  . Status post arthroscopy of right shoulder 07/04/2017  . Impingement syndrome of right shoulder 10/04/2016  . Pulmonary sarcoidosis ([place]) 11/28/2015  . Cough 11/12/2015    Deniece Ree PT, DPT, CBIS  Supplemental Physical Therapist [place]   Pager [facility] [address] [place], [place], [address] Phone: [phone]   Fax:  [phone]  Name: JOHNATHEN TESTA MRN: [id number] Date of Birth: 12-16-1955

## 2017-08-20 IMAGING — CT CT CHEST HIGH RESOLUTION W/O CM
2 of 5 series · 14 of 36 positions shown, 17 images · non-contrast
Comparison: None.

CLINICAL DATA: Nonproductive cough for 3 months. Restrictive lung
disease. Nonsmoker.

EXAM:
CT CHEST WITHOUT CONTRAST
TECHNIQUE: Multidetector CT imaging of the chest was performed following the
standard protocol without intravenous contrast. High resolution
imaging of the lungs, as well as inspiratory and expiratory imaging,
was performed.

[Series 4: high resolution · axial · 0.66mm/px · z∈[-327,-49]mm · 11 of 153 slices shown, 14 images]
[im 7/153  mediastinal]
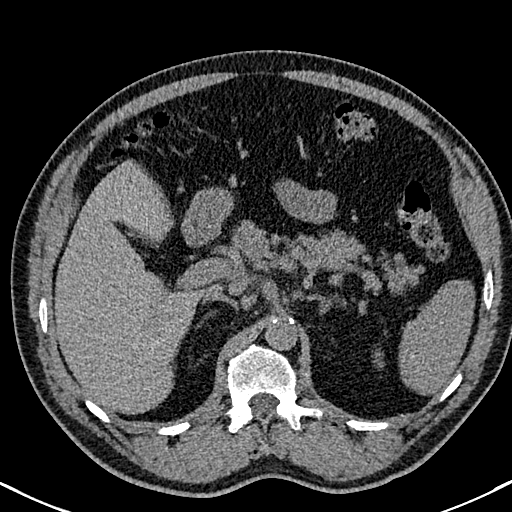
[im 7/153  lung]
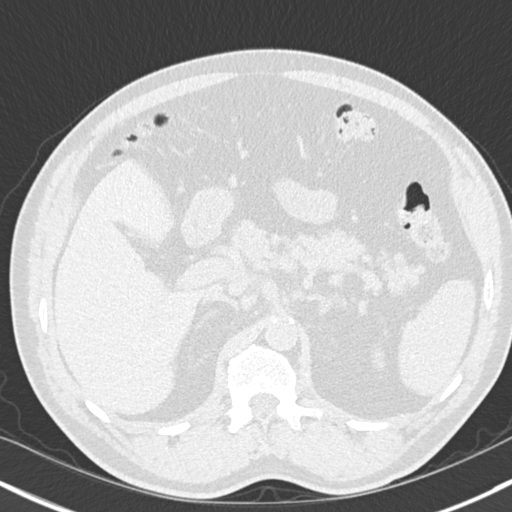
[im 21/153  lung]
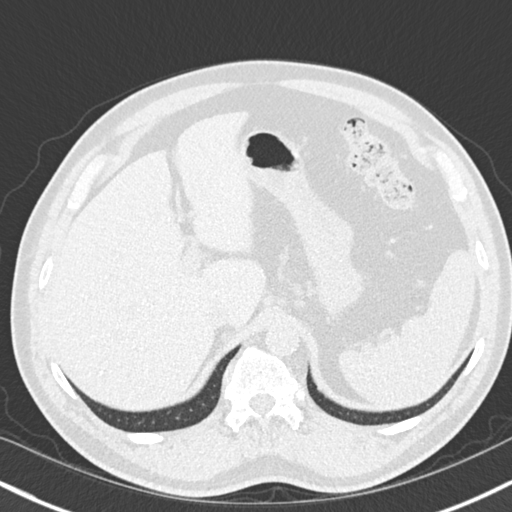
[im 35/153  lung]
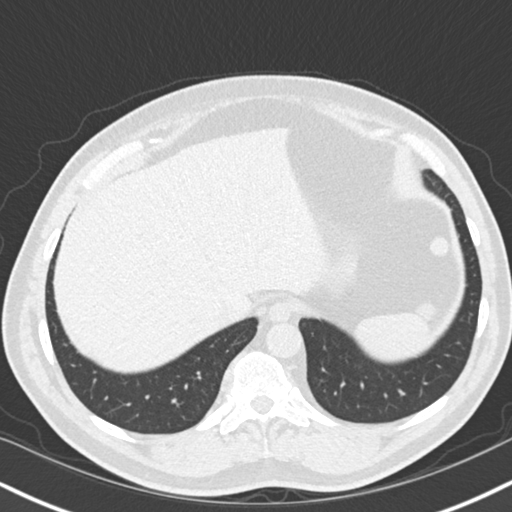
[im 49/153  lung]
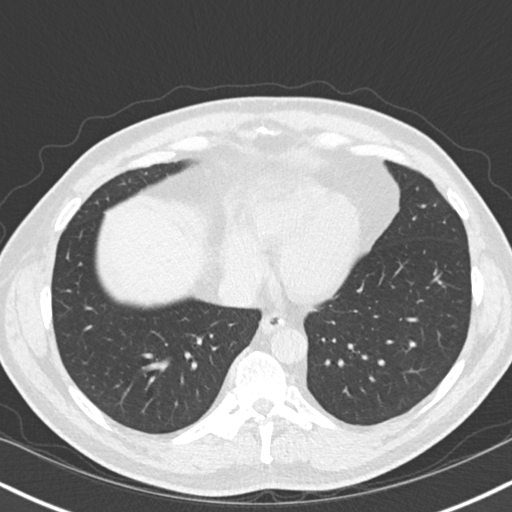
[im 63/153  mediastinal]
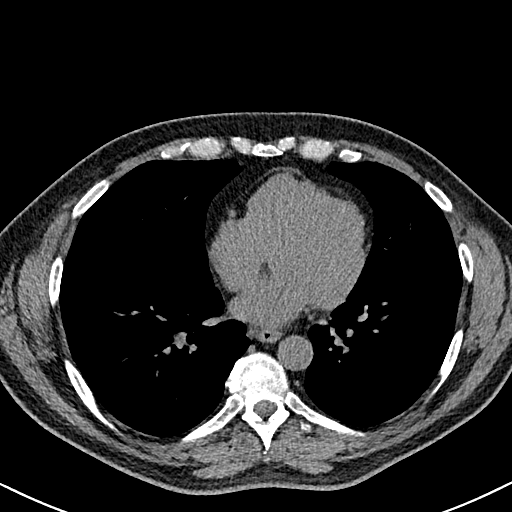
[im 63/153  lung]
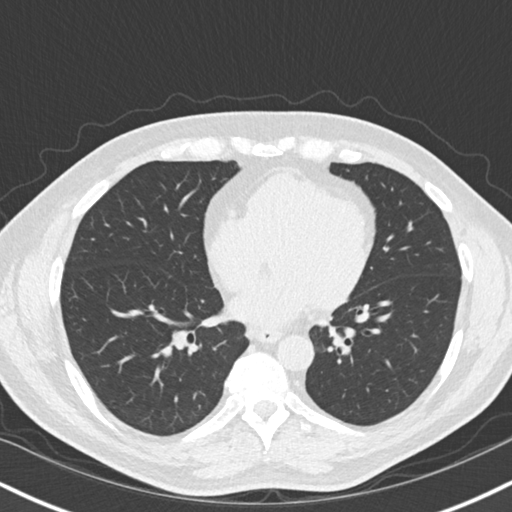
[im 77/153  lung]
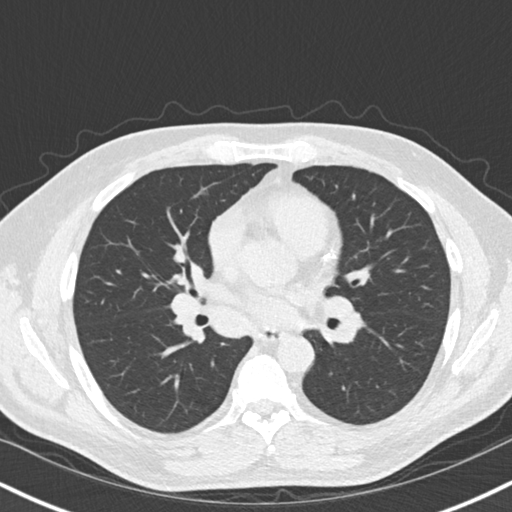
[im 90/153  lung]
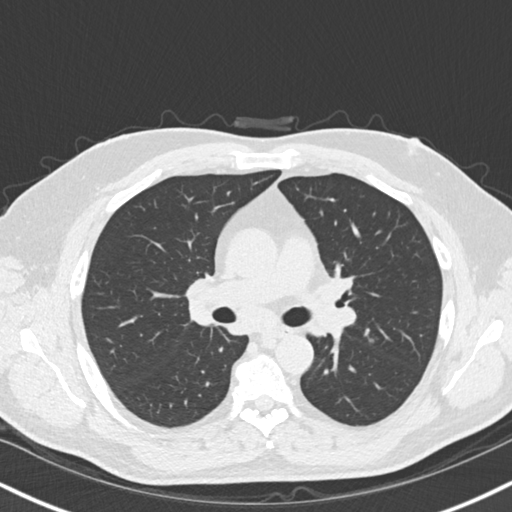
[im 104/153  lung]
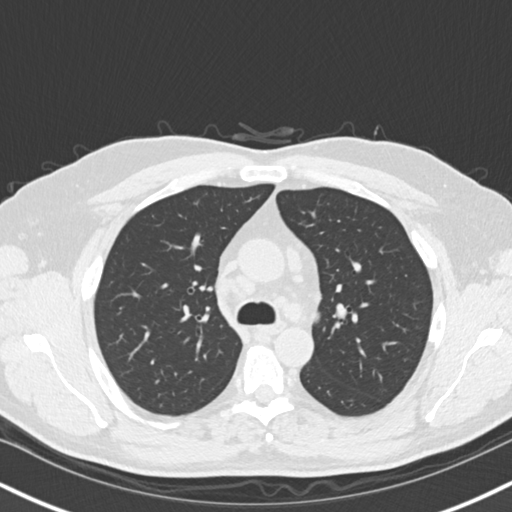
[im 118/153  mediastinal]
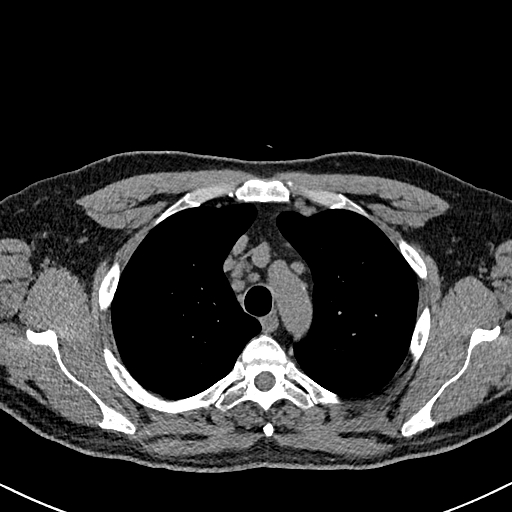
[im 118/153  lung]
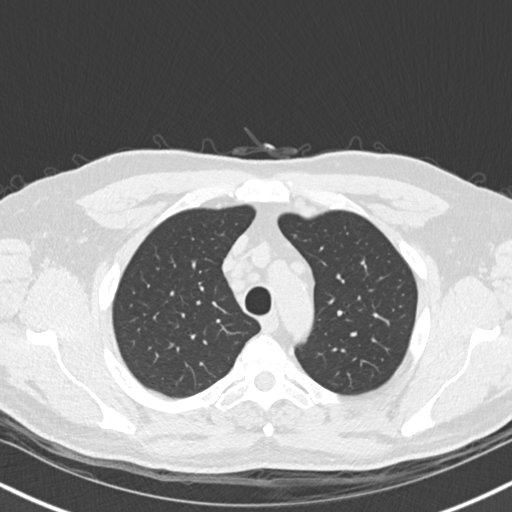
[im 132/153  lung]
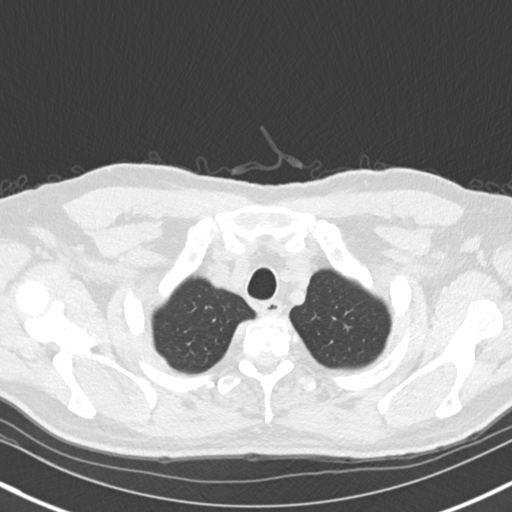
[im 146/153  lung]
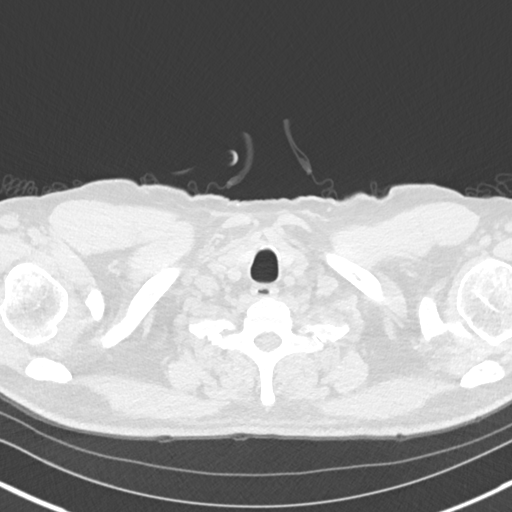

[Series 8: coronal · coronal · 0.59mm/px · 3 of 120 slices shown]
[im 24/120  lung]
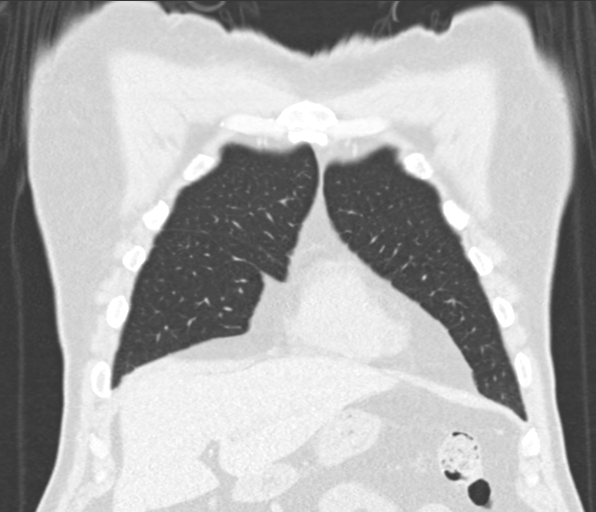
[im 48/120  lung]
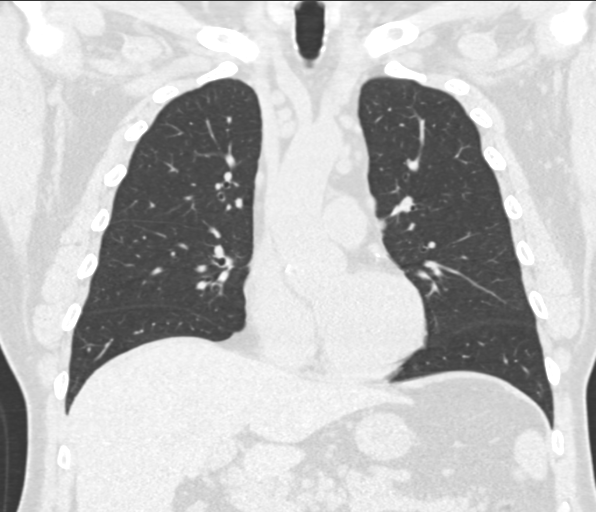
[im 72/120  lung]
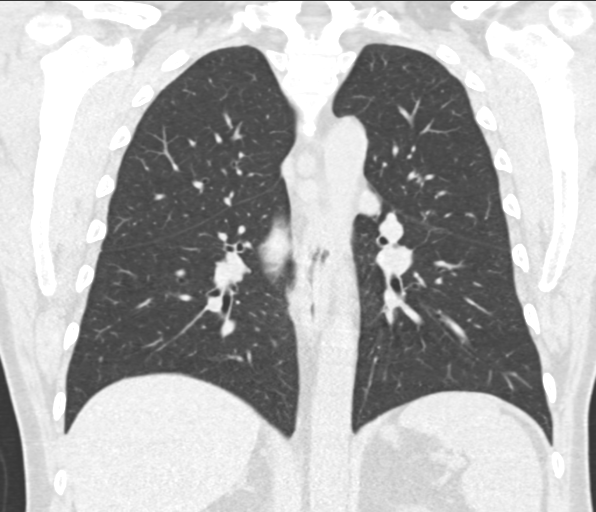

[14 of 36 positions shown; findings below may reference images not displayed]

FINDINGS: Cardiovascular: Normal heart size. No significant pericardial
fluid/thickening. Left anterior descending coronary atherosclerosis.
Atherosclerotic nonaneurysmal thoracic aorta. Normal caliber
pulmonary arteries.

Mediastinum/Nodes: Suggestion of a 1.0 cm hypodense right thyroid
lobe nodule. Unremarkable esophagus. No axillary adenopathy. Mild
right paratracheal adenopathy measuring up to 1.7 cm (series 4/
image 43). AP window adenopathy measuring up to 1.2 cm (series 4/
image 53) enlarged 1.8 cm subcarinal node (series 4/ image 69).
Enlarged 1.0 cm left internal mammary node (series 4/ image 33).
Mild to moderate bilateral hilar adenopathy, poorly delineated on
this noncontrast study.

Lungs/Pleura: No pneumothorax. No pleural effusion. There a few
scattered central peribronchial solid nodules in both lungs, for
example a 9 mm central right lower lobe solid nodule (series 5/image
90), a 7 mm central left upper lobe solid nodule (series 5/ image
62) and a 7 mm central left lower lobe solid nodule (series 5/ image
92). There is a 10 mm solid subpleural pulmonary nodule in the
medial left upper lobe (series 5/image 68). There are a few
additional tiny solid pulmonary nodules associated with the
peripheral peribronchovascular structures, for example a 5 mm
posterior left upper lobe nodule (series 5/ image 63). No acute
consolidative airspace disease or lung masses. No significant
regions of subpleural reticulation, ground-glass attenuation,
traction bronchiectasis, architectural distortion, parenchymal
banding or frank honeycombing. No significant air trapping on the
expiration sequence.

Upper abdomen: Unremarkable.

Musculoskeletal: No aggressive appearing focal osseous lesions. Mild
thoracic spondylosis.
IMPRESSION: 1. Moderate mediastinal and bilateral hilar lymphadenopathy. Several
scattered solid pulmonary nodules in both lungs, the largest
measuring 10 mm in the subpleural medial left upper lobe, although
most of which are located in the peribronchial central lungs and
measure up to 9 mm in the right lower lobe. Leading diagnostic
considerations are sarcoidosis or lymphoma. Tissue sampling and/or
short-term follow-up chest CT with IV contrast is advised.
2. No evidence of interstitial lung disease.
3. Possible 1.0 cm right thyroid lobe nodule, for which no further
evaluation is required. This follows ACR consensus guidelines:
Managing Incidental Thyroid Nodules Detected on Imaging: White Paper
of [REDACTED]. [HOSPITAL]
0894; [DATE].
4. Additional findings include aortic atherosclerosis and 1 vessel
coronary atherosclerosis.

## 2017-08-23 ENCOUNTER — Encounter: Payer: Self-pay | Admitting: Physical Therapy

## 2017-08-23 ENCOUNTER — Ambulatory Visit: Payer: 59 | Admitting: Physical Therapy

## 2017-08-23 DIAGNOSIS — M25511 Pain in right shoulder: Secondary | ICD-10-CM

## 2017-08-23 DIAGNOSIS — R293 Abnormal posture: Secondary | ICD-10-CM

## 2017-08-23 DIAGNOSIS — G8929 Other chronic pain: Secondary | ICD-10-CM

## 2017-08-23 DIAGNOSIS — M25611 Stiffness of right shoulder, not elsewhere classified: Secondary | ICD-10-CM

## 2017-08-23 DIAGNOSIS — M6281 Muscle weakness (generalized): Secondary | ICD-10-CM

## 2017-08-23 NOTE — Therapy (Signed)
[place], [place], [address] Phone: [phone]   Fax:  [phone]  Physical Therapy Treatment  Patient Details  Name: Robert Morrison MRN: [id number] Date of Birth: 26-Sep-1955 Referring Provider: Jean Rosenthal    Encounter Date: 08/23/2017  PT End of Session - 08/23/17 1144    Visit Number  13    Number of Visits  17    Date for PT Re-Evaluation  08/30/17    Authorization Type  [employer]     Authorization Time Period  07/07/17 to 09/06/17    PT Start Time  1113 moist heat not included in billing     PT Stop Time  1143    PT Time Calculation (min)  30 min    Activity Tolerance  Patient tolerated treatment well    Behavior During Therapy  [facility] for tasks assessed/performed       Past Medical History:  Diagnosis Date  . Allergy    takes Claritin daily  . GERD (gastroesophageal reflux disease)    takes OMeprazole daily  . History of bronchitis    around 08  . History of colon polyps    benign  . Hyperlipidemia   . Pneumonia    hx of couple of yrs ago  . PONV (postoperative nausea and vomiting)     Past Surgical History:  Procedure Laterality Date  . COLONOSCOPY  12-05-2009  . COLONOSCOPY    . ELBOW FRACTURE SURGERY Left 1998  . ENDOBRONCHIAL ULTRASOUND Bilateral 12/08/2015   Procedure: ENDOBRONCHIAL ULTRASOUND;  Surgeon: Juanito Doom, MD;  Location: [facility];  Service: Cardiopulmonary;  Laterality: Bilateral;  . MEDIASTINOSCOPY N/A 01/29/2016   Procedure: MEDIASTINOSCOPY;  Surgeon: Melrose Nakayama, MD;  Location: [facility];  Service: Thoracic;  Laterality: N/A;  . POLYPECTOMY    . ROTATOR CUFF REPAIR Left 2010    There were no vitals filed for this visit.  Subjective Assessment - 08/23/17 1104    Subjective  The area we worked on last time was pretty sore; if I pull my arm back or turned it over, it hurt the next two days then it was better. I've only put the heat  on a couple times. It started feeling better on Sunday.     Patient Stated Goals  get my right shoulder as good as my left shoulder     Currently in Pain?  Yes    Pain Score  1     Pain Location  Shoulder    Pain Orientation  Right    Pain Descriptors / Indicators  Burning    Pain Type  Acute pain    Pain Radiating Towards  none     Pain Onset  In the past 7 days    Pain Frequency  Intermittent    Aggravating Factors   fast movements    Pain Relieving Factors  heat, slower movements     Effect of Pain on Daily Activities  moderate                        [facility] Adult PT Treatment/Exercise - 08/23/17 0001      Shoulder Exercises: Supine   Flexion  Right;AAROM;AROM;15 reps    Other Supine Exercises  R shoulder flexion, ABD, ER/IR stretches 1x15       Shoulder Exercises: Stretch   Other Shoulder Stretches  pec stretch on doorframe 1x60 seconds       Moist Heat  Therapy   Number Minutes Moist Heat  10 Minutes not included in billing     Moist Heat Location  Shoulder      Manual Therapy   Manual Therapy  Soft tissue mobilization    Manual therapy comments  separate from all other skilled services     Soft tissue mobilization  distal attachment of pec major/minor R  following heat              PT Education - 08/23/17 1144    Education provided  Yes    Education Details  benefits of DN, POC moving forward     Person(s) Educated  Patient    Methods  Explanation    Comprehension  Verbalized understanding       PT Short Term Goals - 08/02/17 1116      PT SHORT TERM GOAL #1   Title  Patient to demonstrate R shoulder AAROM and AROM as being full and pain free in order to improve gross shoulder mobility and mechanics     Baseline  6/18- improving, see flowsheets    Time  4    Period  Weeks    Status  On-going      PT SHORT TERM GOAL #2   Title  Patient to demonstrate resolutation of postural deficits in order to support healthy shoulder mechanics and  reduce compensation patterns     Baseline  6/18- improving, more aware     Time  4    Period  Weeks    Status  Achieved      PT SHORT TERM GOAL #3   Title  Patient to report pain as being 0/10 in R shoulder with all functional tasks and activities in order to improve QOL and functional task tolerance     Baseline  6/18- 0/10 at rest but can hurt a little when moving     Time  4    Period  Weeks    Status  On-going      PT SHORT TERM GOAL #4   Title  Patient to be independent in correct performance of appropriate HEP, to be updated PRN     Baseline  6/18- "i need to do more sets" but is compliant     Time  1    Status  Achieved        PT Long Term Goals - 08/02/17 1120      PT LONG TERM GOAL #1   Title  Patient to demonstrate functional strength of 5/5 in all tested muscles in order to show improved strength and stability of R shoulder     Baseline  6/18- flowsheets     Time  8    Period  Weeks    Status  On-going      PT LONG TERM GOAL #2   Title  Patient to be able to perform functional overhead activities without compensation patterns or scapular winging in order to ensure good shoulder mechanics     Baseline  6/18- ongoing     Time  8    Period  Weeks    Status  On-going      PT LONG TERM GOAL #3   Title  Patient to be able to perform all self-care activities such as dressing/bathing without pain or ROM limitations in order to improve QOL     Baseline  6/18- ongoing, but improving     Time  8    Period  Weeks  Status  On-going      PT LONG TERM GOAL #4   Title  Patient to be able to perform all functional daily tasks and activities without pain in order to improve QOL and demonstrate restored shoulder function     Baseline  6/18- continues to have pain with use of arm             Plan - 08/23/17 1144    Clinical Impression Statement  Continued with moist heat and STM to R distal pecs due to ongoing muscle spasm continuing to cause pain and otherwise limit  function. Otherwise continued with ROM based activities as able and tolerated with progressions performed as able this session. Patient will be due for re-assessment soon, but may benefit from DN to pectoral musculature to assist in reducing tightness and improving ROM.     Rehab Potential  Good    Clinical Impairments Affecting Rehab Potential  (+) appears motivated to participate, success with PT in the past; (-) poor awareness of safety and compensations present     PT Frequency  2x / week    PT Duration  4 weeks    PT Treatment/Interventions  ADLs/Self Care Home Management;Biofeedback;Cryotherapy;Electrical Stimulation;Iontophoresis 4mg /ml Dexamethasone;Moist Heat;Ultrasound;Functional mobility training;Therapeutic activities;Therapeutic exercise;Balance training;Neuromuscular re-education;Patient/family education;Manual techniques;Scar mobilization;Passive range of motion;Dry needling;Taping    PT Next Visit Plan  continue heat/STM to pec, consider DN. AAROM/AROM.     PT Home Exercise Plan  Eval: supine flexion AAROM, supine ABD AAROM, standing ER doorway PROM stretch, isometrics, wall walks, seated ABD stretch sleeper stretches X2, and posterior capsuel stretch    Consulted and Agree with Plan of Care  Patient       Patient will benefit from skilled therapeutic intervention in order to improve the following deficits and impairments:  Increased fascial restricitons, Improper body mechanics, Pain, Decreased coordination, Decreased mobility, Decreased scar mobility, Increased muscle spasms, Postural dysfunction, Decreased range of motion, Decreased strength, Hypomobility, Impaired UE functional use, Impaired flexibility  Visit Diagnosis: Stiffness of right shoulder, not elsewhere classified  Chronic right shoulder pain  Abnormal posture  Muscle weakness (generalized)     Problem List Patient Active Problem List   Diagnosis Date Noted  . Status post arthroscopy of right shoulder  07/04/2017  . Impingement syndrome of right shoulder 10/04/2016  . Pulmonary sarcoidosis ([place]) 11/28/2015  . Cough 11/12/2015    Deniece Ree PT, DPT, CBIS  Supplemental Physical Therapist [place]   Pager [facility] [address] [place], [place], [address] Phone: [phone]   Fax:  [phone]  Name: Robert Morrison MRN: [id number] Date of Birth: [birth year]

## 2017-08-25 ENCOUNTER — Encounter: Payer: Self-pay | Admitting: Physical Therapy

## 2017-08-25 ENCOUNTER — Ambulatory Visit: Payer: 59 | Admitting: Physical Therapy

## 2017-08-25 DIAGNOSIS — G8929 Other chronic pain: Secondary | ICD-10-CM

## 2017-08-25 DIAGNOSIS — M25611 Stiffness of right shoulder, not elsewhere classified: Secondary | ICD-10-CM | POA: Diagnosis not present

## 2017-08-25 DIAGNOSIS — R293 Abnormal posture: Secondary | ICD-10-CM

## 2017-08-25 DIAGNOSIS — M6281 Muscle weakness (generalized): Secondary | ICD-10-CM

## 2017-08-25 DIAGNOSIS — M25511 Pain in right shoulder: Secondary | ICD-10-CM

## 2017-08-25 NOTE — Therapy (Signed)
[place], [place], [address] Phone: [phone]   Fax:  [phone]  Physical Therapy Treatment  Patient Details  Name: Robert Morrison MRN: [id number] Date of Birth: [birth year] Referring Provider: Jean Rosenthal    Encounter Date: 08/25/2017  PT End of Session - 08/25/17 1256    Visit Number  14    Number of Visits  17    Date for PT Re-Evaluation  08/30/17    PT Start Time  1103    PT Stop Time  1145    PT Time Calculation (min)  42 min    Activity Tolerance  Patient tolerated treatment well    Behavior During Therapy  [facility] for tasks assessed/performed       Past Medical History:  Diagnosis Date  . Allergy    takes Claritin daily  . GERD (gastroesophageal reflux disease)    takes OMeprazole daily  . History of bronchitis    around 08  . History of colon polyps    benign  . Hyperlipidemia   . Pneumonia    hx of couple of yrs ago  . PONV (postoperative nausea and vomiting)     Past Surgical History:  Procedure Laterality Date  . COLONOSCOPY  12-05-2009  . COLONOSCOPY    . ELBOW FRACTURE SURGERY Left 1998  . ENDOBRONCHIAL ULTRASOUND Bilateral 12/08/2015   Procedure: ENDOBRONCHIAL ULTRASOUND;  Surgeon: Juanito Doom, MD;  Location: [facility];  Service: Cardiopulmonary;  Laterality: Bilateral;  . MEDIASTINOSCOPY N/A 01/29/2016   Procedure: MEDIASTINOSCOPY;  Surgeon: Melrose Nakayama, MD;  Location: [place];  Service: Thoracic;  Laterality: N/A;  . POLYPECTOMY    . ROTATOR CUFF REPAIR Left 2010    There were no vitals filed for this visit.  Subjective Assessment - 08/25/17 1108    Subjective  Pain in Pec feels better now    Currently in Pain?  No/denies    Pain Location  Shoulder    Pain Orientation  Right    Pain Descriptors / Indicators  -- stiff    Aggravating Factors   fast movement ,  driving  full circles fast on wheel     Pain Relieving Factors  heat slower  movements.                       [facility] Adult PT Treatment/Exercise - 08/25/17 0001      Shoulder Exercises: Pulleys   Flexion  2 minutes    Scaption  2 minutes    ABduction  2 minutes      Manual Therapy   Manual Therapy  Soft tissue mobilization    Manual therapy comments  separate from all other skilled services     Joint Mobilization  grade II,  III    Soft tissue mobilization  gentle distraction     Scapular Mobilization  right    Passive ROM  right shoulder             PT Education - 08/25/17 1255    Education provided  Yes    Education Details  stretching ideas    Person(s) Educated  Patient    Methods  Explanation    Comprehension  Verbalized understanding       PT Short Term Goals - 08/02/17 1116      PT SHORT TERM GOAL #1   Title  Patient to demonstrate R shoulder AAROM and AROM as being full and pain free in  order to improve gross shoulder mobility and mechanics     Baseline  6/18- improving, see flowsheets    Time  4    Period  Weeks    Status  On-going      PT SHORT TERM GOAL #2   Title  Patient to demonstrate resolutation of postural deficits in order to support healthy shoulder mechanics and reduce compensation patterns     Baseline  6/18- improving, more aware     Time  4    Period  Weeks    Status  Achieved      PT SHORT TERM GOAL #3   Title  Patient to report pain as being 0/10 in R shoulder with all functional tasks and activities in order to improve QOL and functional task tolerance     Baseline  6/18- 0/10 at rest but can hurt a little when moving     Time  4    Period  Weeks    Status  On-going      PT SHORT TERM GOAL #4   Title  Patient to be independent in correct performance of appropriate HEP, to be updated PRN     Baseline  6/18- "i need to do more sets" but is compliant     Time  1    Status  Achieved        PT Long Term Goals - 08/25/17 1259      PT LONG TERM GOAL #1   Title  Patient to demonstrate  functional strength of 5/5 in all tested muscles in order to show improved strength and stability of R shoulder     Time  8    Period  Weeks      PT LONG TERM GOAL #2   Baseline  has compensation,  with overhead,   improving    Time  8    Period  Weeks    Status  On-going      PT LONG TERM GOAL #3   Title  Patient to be able to perform all self-care activities such as dressing/bathing without pain or ROM limitations in order to improve QOL     Baseline  no reported self care issues.  Did not assess    Time  8    Period  Weeks    Status  Unable to assess      PT LONG TERM GOAL #4   Title  Patient to be able to perform all functional daily tasks and activities without pain in order to improve QOL and demonstrate restored shoulder function     Baseline  quick movements painful driving,  reaching    Time  8    Period  Weeks    Status  On-going            Plan - 08/25/17 1256    Clinical Impression Statement  Pain pec area improving.  manual today helpful.  ROM improved 120 +  flexion.  No pain at end of day she declined the need for modalities.    PT Next Visit Plan  continue heat/STM to pec, consider DN. AAROM/AROM.   Manual    PT Home Exercise Plan  Eval: supine flexion AAROM, supine ABD AAROM, standing ER doorway PROM stretch, isometrics, wall walks, seated ABD stretch sleeper stretches X2, and posterior capsuel stretch    Consulted and Agree with Plan of Care  Patient       Patient will benefit from skilled therapeutic intervention in order  to improve the following deficits and impairments:     Visit Diagnosis: Stiffness of right shoulder, not elsewhere classified  Chronic right shoulder pain  Abnormal posture  Muscle weakness (generalized)     Problem List Patient Active Problem List   Diagnosis Date Noted  . Status post arthroscopy of right shoulder 07/04/2017  . Impingement syndrome of right shoulder 10/04/2016  . Pulmonary sarcoidosis ([place]) 11/28/2015  .  Cough 11/12/2015    Deltha Bernales PTA 08/25/2017, 1:02 PM  [facility] [address] [place], [place], [address] Phone: [phone]   Fax:  [phone]  Name: Robert Morrison MRN: [id number] Date of Birth: 02-Nov-1955

## 2017-08-30 ENCOUNTER — Encounter: Payer: Self-pay | Admitting: Physical Therapy

## 2017-08-30 ENCOUNTER — Ambulatory Visit: Payer: 59 | Admitting: Physical Therapy

## 2017-08-30 DIAGNOSIS — M25611 Stiffness of right shoulder, not elsewhere classified: Secondary | ICD-10-CM

## 2017-08-30 DIAGNOSIS — R293 Abnormal posture: Secondary | ICD-10-CM

## 2017-08-30 DIAGNOSIS — M6281 Muscle weakness (generalized): Secondary | ICD-10-CM

## 2017-08-30 DIAGNOSIS — M25511 Pain in right shoulder: Secondary | ICD-10-CM

## 2017-08-30 DIAGNOSIS — G8929 Other chronic pain: Secondary | ICD-10-CM

## 2017-08-30 NOTE — Therapy (Signed)
[place], [place], [address] Phone: [phone]   Fax:  [phone]  Physical Therapy Treatment / Re-certification  Patient Details  Name: Robert Morrison MRN: [id number] Date of Birth: 25-Apr-1955 Referring Provider: Jean Rosenthal    Encounter Date: 08/30/2017  PT End of Session - 08/30/17 1059    Visit Number  15    Number of Visits  17    Date for PT Re-Evaluation  09/20/17    Authorization Type  [employer]     PT Start Time  1100    PT Stop Time  1144    PT Time Calculation (min)  44 min    Activity Tolerance  Patient tolerated treatment well    Behavior During Therapy  [facility] for tasks assessed/performed       Past Medical History:  Diagnosis Date  . Allergy    takes Claritin daily  . GERD (gastroesophageal reflux disease)    takes OMeprazole daily  . History of bronchitis    around 08  . History of colon polyps    benign  . Hyperlipidemia   . Pneumonia    hx of couple of yrs ago  . PONV (postoperative nausea and vomiting)     Past Surgical History:  Procedure Laterality Date  . COLONOSCOPY  12-05-2009  . COLONOSCOPY    . ELBOW FRACTURE SURGERY Left 1998  . ENDOBRONCHIAL ULTRASOUND Bilateral 12/08/2015   Procedure: ENDOBRONCHIAL ULTRASOUND;  Surgeon: Juanito Doom, MD;  Location: [facility];  Service: Cardiopulmonary;  Laterality: Bilateral;  . MEDIASTINOSCOPY N/A 01/29/2016   Procedure: MEDIASTINOSCOPY;  Surgeon: Melrose Nakayama, MD;  Location: [facility];  Service: Thoracic;  Laterality: N/A;  . POLYPECTOMY    . ROTATOR CUFF REPAIR Left 2010    There were no vitals filed for this visit.  Subjective Assessment - 08/30/17 1056    Subjective  "I am doing overall pretty good, pain continusts to fluctuate between 0-1/10 mostly stiff, only pain once I start reaching .     Patient Stated Goals  get my right shoulder as good as my left shoulder     Currently in  Pain?  No/denies    Pain Score  0-No pain    Pain Location  Shoulder    Pain Orientation  Right    Pain Type  Surgical pain    Pain Onset  In the past 7 days    Pain Frequency  Intermittent    Aggravating Factors   reaching out and above shoulder    Pain Relieving Factors  heat and slow controlled movements         [facility] PT Assessment - 08/30/17 1111      Observation/Other Assessments   Focus on Therapeutic Outcomes (FOTO)   27% limited      AROM   Right Shoulder Flexion  115 Degrees    Right Shoulder ABduction  113 Degrees    Right Shoulder Internal Rotation  40 Degrees measured at 90/90    Right Shoulder External Rotation  48 Degrees measured at 90/90      Strength   Right Shoulder Flexion  4-/5 in available ROM    Right Shoulder ABduction  4-/5 in available ROM, with hiking    Right Shoulder Internal Rotation  4/5 pain during testing    Right Shoulder External Rotation  4-/5                   [facility] Adult  PT Treatment/Exercise - 08/30/17 1129      Shoulder Exercises: Supine   Protraction  -- 3 x 30 rhytmic stablization with sustained protraction      Shoulder Exercises: Standing   Other Standing Exercises  lower trap wall Y's 2 x 10       Shoulder Exercises: Stretch   Other Shoulder Stretches  rhomboid stretch 2 x 30 sec       Manual Therapy   Manual therapy comments  MTPR along aong the R upper trap, Rhomboids, and pec major, and sub-scapualar  (with pt in externally rotated position)    Scapular Mobilization  scapular assist with pt active shoulder flexion / abduction             PT Education - 08/30/17 1147    Education provided  Yes    Education Details  muscle anatomy and referral patterns. What TPDN is, beneftis and what to expect/ after care. updated HEp for rhomboid stretching    Person(s) Educated  Patient    Methods  Explanation;Verbal cues;Demonstration    Comprehension  Verbalized understanding;Verbal cues required       PT  Short Term Goals - 08/30/17 1117      PT SHORT TERM GOAL #1   Title  Patient to demonstrate R shoulder AAROM and AROM as being full and pain free in order to improve gross shoulder mobility and mechanics     Period  Weeks    Status  On-going      PT SHORT TERM GOAL #2   Title  Patient to demonstrate resolutation of postural deficits in order to support healthy shoulder mechanics and reduce compensation patterns     Period  Weeks    Status  Achieved      PT SHORT TERM GOAL #3   Title  Patient to report pain as being 0/10 in R shoulder with all functional tasks and activities in order to improve QOL and functional task tolerance     Baseline  reaching pain increases to about a 1/10    Time  4    Period  Weeks    Status  Partially Met      PT SHORT TERM GOAL #4   Title  Patient to be independent in correct performance of appropriate HEP, to be updated PRN     Time  1    Period  Weeks    Status  Achieved        PT Long Term Goals - 08/30/17 1118      PT LONG TERM GOAL #1   Title  Patient to demonstrate functional strength of 5/5 in all tested muscles in order to show improved strength and stability of R shoulder     Baseline  7/16- flowsheets     Time  8    Period  Weeks    Status  On-going      PT LONG TERM GOAL #2   Title  Patient to be able to perform functional overhead activities without compensation patterns or scapular winging in order to ensure good shoulder mechanics     Baseline  has compensation,  with overhead with shoulder hiking     Time  8    Period  Weeks    Status  On-going      PT LONG TERM GOAL #3   Title  Patient to be able to perform all self-care activities such as dressing/bathing without pain or ROM limitations in order to improve QOL  Period  Weeks    Status  Achieved      PT LONG TERM GOAL #4   Title  Patient to be able to perform all functional daily tasks and activities without pain in order to improve QOL and demonstrate restored shoulder  function     Time  8    Period  Weeks    Status  On-going            Plan - 08/30/17 1148    Clinical Impression Statement  Pt reports only 1/10 pain at max with reaching above his head / shoulder. He demonstrated slight reduction in shoulder flexion and is maintaining shoulder abduction. he is making some progress on his LTG's. discussed TPDN with pt opted to hold off on today. focused on manual trigger point release and shoulder mobility exercise. improved mobility following todays session. plan to continue with treatment 1 x a week for the next 3 weeks to work on remaining goals and independent HEP and see what MD's assessment is on 7/29.     PT Frequency  1x / week    PT Duration  3 weeks    PT Treatment/Interventions  ADLs/Self Care Home Management;Biofeedback;Cryotherapy;Electrical Stimulation;Iontophoresis 38m/ml Dexamethasone;Moist Heat;Ultrasound;Functional mobility training;Therapeutic activities;Therapeutic exercise;Balance training;Neuromuscular re-education;Patient/family education;Manual techniques;Scar mobilization;Passive range of motion;Dry needling;Taping    PT Next Visit Plan  continue heat/STM to pec, consider DN. AAROM/AROM.   scapular assist, scapulohumeral rhythm    PT Home Exercise Plan  Eval: supine flexion AAROM, supine ABD AAROM, standing ER doorway PROM stretch, isometrics, wall walks, seated ABD stretch sleeper stretches X2, and posterior capsuel stretch, rhomboid stretch    Consulted and Agree with Plan of Care  Patient       Patient will benefit from skilled therapeutic intervention in order to improve the following deficits and impairments:  Increased fascial restricitons, Improper body mechanics, Pain, Decreased coordination, Decreased mobility, Decreased scar mobility, Increased muscle spasms, Postural dysfunction, Decreased range of motion, Decreased strength, Hypomobility, Impaired UE functional use, Impaired flexibility  Visit Diagnosis: Stiffness of  right shoulder, not elsewhere classified  Chronic right shoulder pain  Abnormal posture  Muscle weakness (generalized)     Problem List Patient Active Problem List   Diagnosis Date Noted  . Status post arthroscopy of right shoulder 07/04/2017  . Impingement syndrome of right shoulder 10/04/2016  . [place]oidosis [place] 11/28/2015  . Cough 11/12/2015   KStarr LakePT, DPT, LAT, ATC  08/30/17  11:57 AM      [facility]GCentral N[place] [address]Phone: [phone]  Fax:  [phone] Name: EANTONINO NIENHUISMRN: [id number] of Birth: 103/12/1955

## 2017-09-01 ENCOUNTER — Ambulatory Visit: Payer: 59 | Admitting: Physical Therapy

## 2017-09-01 ENCOUNTER — Encounter: Payer: Self-pay | Admitting: Physical Therapy

## 2017-09-01 DIAGNOSIS — M25511 Pain in right shoulder: Secondary | ICD-10-CM

## 2017-09-01 DIAGNOSIS — M25611 Stiffness of right shoulder, not elsewhere classified: Secondary | ICD-10-CM

## 2017-09-01 DIAGNOSIS — M6281 Muscle weakness (generalized): Secondary | ICD-10-CM

## 2017-09-01 DIAGNOSIS — G8929 Other chronic pain: Secondary | ICD-10-CM

## 2017-09-01 DIAGNOSIS — R293 Abnormal posture: Secondary | ICD-10-CM

## 2017-09-01 NOTE — Patient Instructions (Signed)
[place] issued from exercise drawer All issued 10 to 20 x each

## 2017-09-01 NOTE — Therapy (Signed)
[place], [place], [address] Phone: [phone]   Fax:  [phone]  Physical Therapy Treatment  Patient Details  Name: Robert Morrison MRN: [id number] Date of Birth: 09/25/1955 Referring Provider: Jean Rosenthal    Encounter Date: 09/01/2017  PT End of Session - 09/01/17 1330    Visit Number  16    Number of Visits  18    Date for PT Re-Evaluation  09/20/17    PT Start Time  1055    PT Stop Time  1145    PT Time Calculation (min)  50 min    Activity Tolerance  Patient tolerated treatment well    Behavior During Therapy  [facility] for tasks assessed/performed       Past Medical History:  Diagnosis Date  . Allergy    takes Claritin daily  . GERD (gastroesophageal reflux disease)    takes OMeprazole daily  . History of bronchitis    around 08  . History of colon polyps    benign  . Hyperlipidemia   . Pneumonia    hx of couple of yrs ago  . PONV (postoperative nausea and vomiting)     Past Surgical History:  Procedure Laterality Date  . COLONOSCOPY  12-05-2009  . COLONOSCOPY    . ELBOW FRACTURE SURGERY Left 1998  . ENDOBRONCHIAL ULTRASOUND Bilateral 12/08/2015   Procedure: ENDOBRONCHIAL ULTRASOUND;  Surgeon: Juanito Doom, MD;  [facility]: [facility];  Service: Cardiopulmonary;  Laterality: Bilateral;  . MEDIASTINOSCOPY N/A 01/29/2016   Procedure: MEDIASTINOSCOPY;  Surgeon: Melrose Nakayama, MD;  Location: [facility];  Service: Thoracic;  Laterality: N/A;  . POLYPECTOMY    . ROTATOR CUFF REPAIR Left 2010    There were no vitals filed for this visit.  Subjective Assessment - 09/01/17 1104    Subjective  Has pain with over head stretch .  3  to 8/10.   No pain with driving now with slow movements  for turning left.  He is doing the HEP.      Currently in Pain?  No/denies    Pain Score  -- upn  to 8/10    Pain Location  Shoulder    Pain Orientation  Right    Pain Descriptors /  Indicators  Sharp Brief    Pain Type  Surgical pain    Pain Frequency  Intermittent    Aggravating Factors   reaching out and above,  quick reach  washing hair,  pulling tee shirt over head    Pain Relieving Factors  rest    Effect of Pain on Daily Activities  moderate                       [facility] Adult PT Treatment/Exercise - 09/01/17 0001      Shoulder Exercises: Supine   Protraction  10 reps 3 angles with over pressure at end  cued      Shoulder Exercises: Standing   Protraction  10 reps 3 way Down, up, straight  HEP,  green  10 X each    External Rotation  -- with roll HEP    Theraband Level (Shoulder External Rotation)  Level 3 (Green)    Internal Rotation  -- with roll  HEP    Theraband Level (Shoulder Internal Rotation)  Level 3 (Green)    Row  -- single,  HEP    Theraband Level (Shoulder Row)  Level 3 (Green)    Other  Standing Exercises  facing wall stretch into flexion then lift off,  bolcked lumbar compensation      Shoulder Exercises: Pulleys   Flexion  3 minutes      Shoulder Exercises: Stretch   Other Shoulder Stretches  sitting thoracic rotation stretch 3 x each with cues    Other Shoulder Stretches  rhomboid stretch 2 x 30 sec       Manual Therapy   Joint Mobilization  grade II,  III             PT Education - 09/01/17 1330    Education provided  Yes    Education Details  HEP    Person(s) Educated  Patient    Methods  Explanation;Demonstration;Tactile cues;Verbal cues;Handout    Comprehension  Verbalized understanding;Returned demonstration       PT Short Term Goals - 08/30/17 1117      PT SHORT TERM GOAL #1   Title  Patient to demonstrate R shoulder AAROM and AROM as being full and pain free in order to improve gross shoulder mobility and mechanics     Period  Weeks    Status  On-going      PT SHORT TERM GOAL #2   Title  Patient to demonstrate resolutation of postural deficits in order to support healthy shoulder mechanics and  reduce compensation patterns     Period  Weeks    Status  Achieved      PT SHORT TERM GOAL #3   Title  Patient to report pain as being 0/10 in R shoulder with all functional tasks and activities in order to improve QOL and functional task tolerance     Baseline  reaching pain increases to about a 1/10    Time  4    Period  Weeks    Status  Partially Met      PT SHORT TERM GOAL #4   Title  Patient to be independent in correct performance of appropriate HEP, to be updated PRN     Time  1    Period  Weeks    Status  Achieved        PT Long Term Goals - 08/30/17 1118      PT LONG TERM GOAL #1   Title  Patient to demonstrate functional strength of 5/5 in all tested muscles in order to show improved strength and stability of R shoulder     Baseline  7/16- flowsheets     Time  8    Period  Weeks    Status  On-going      PT LONG TERM GOAL #2   Title  Patient to be able to perform functional overhead activities without compensation patterns or scapular winging in order to ensure good shoulder mechanics     Baseline  has compensation,  with overhead with shoulder hiking     Time  8    Period  Weeks    Status  On-going      PT LONG TERM GOAL #3   Title  Patient to be able to perform all self-care activities such as dressing/bathing without pain or ROM limitations in order to improve QOL     Period  Weeks    Status  Achieved      PT LONG TERM GOAL #4   Title  Patient to be able to perform all functional daily tasks and activities without pain in order to improve QOL and demonstrate restored shoulder function  Time  8    Period  Weeks    Status  On-going            Plan - 09/01/17 1528    Clinical Impression Statement  ROM improves, pain improves with PT.  Encouraged patient to be consistat with HEP.  Working toward goals with manual and exercise    PT Next Visit Plan   No pain at end of session.  No modalities needed.  continue heat/STM to pec, consider DN. AAROM/AROM.    scapular assist, scapulohumeral rhythm    PT Home Exercise Plan  Eval: supine flexion AAROM, supine ABD AAROM, standing ER doorway PROM stretch, isometrics, wall walks, seated ABD stretch sleeper stretches X2, and posterior capsuel stretch, rhomboid stretch    Consulted and Agree with Plan of Care  Patient       Patient will benefit from skilled therapeutic intervention in order to improve the following deficits and impairments:     Visit Diagnosis: Stiffness of right shoulder, not elsewhere classified  Chronic right shoulder pain  Abnormal posture  Muscle weakness (generalized)     Problem List Patient Active Problem List   Diagnosis Date Noted  . Status post arthroscopy of right shoulder 07/04/2017  . Impingement syndrome of right shoulder 10/04/2016  . Pulmonary sarcoidosis ([place]) 11/28/2015  . Cough 11/12/2015    HARRIS,KAREN PTA 09/01/2017, 3:30 PM  [facility] [address] [place], [place], [address] Phone: [phone]   Fax:  [phone]  Name: Robert Morrison MRN: [id number] Date of Birth: Sep 30, 1955

## 2017-09-10 IMAGING — DX DG CHEST 2V
2 series · 2 of 2 positions shown · non-contrast
Comparison: Chest CT 11/20/2015

CLINICAL DATA: Cough, congestion and wheezing. History of
bronchoscopy 4 days ago.

EXAM:
CHEST  2 VIEW

[chest pa]
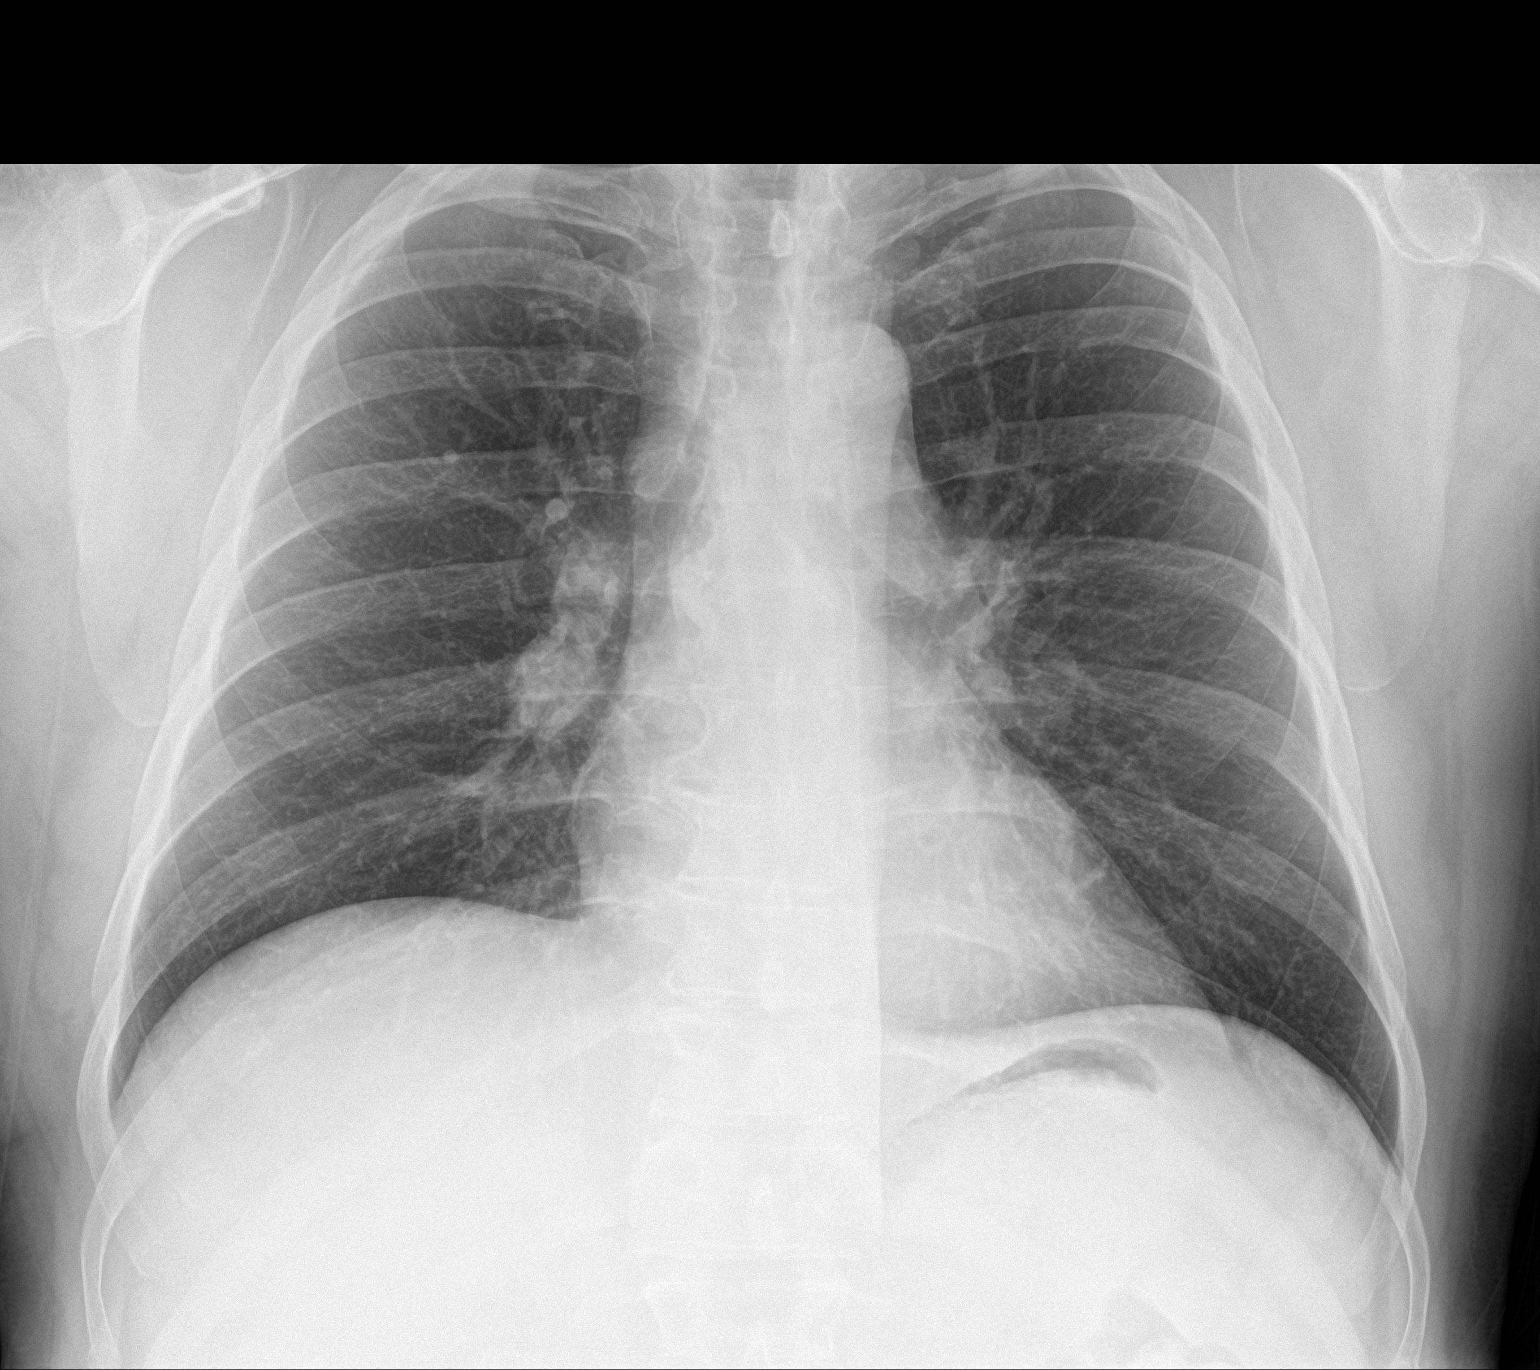

[chest lat]
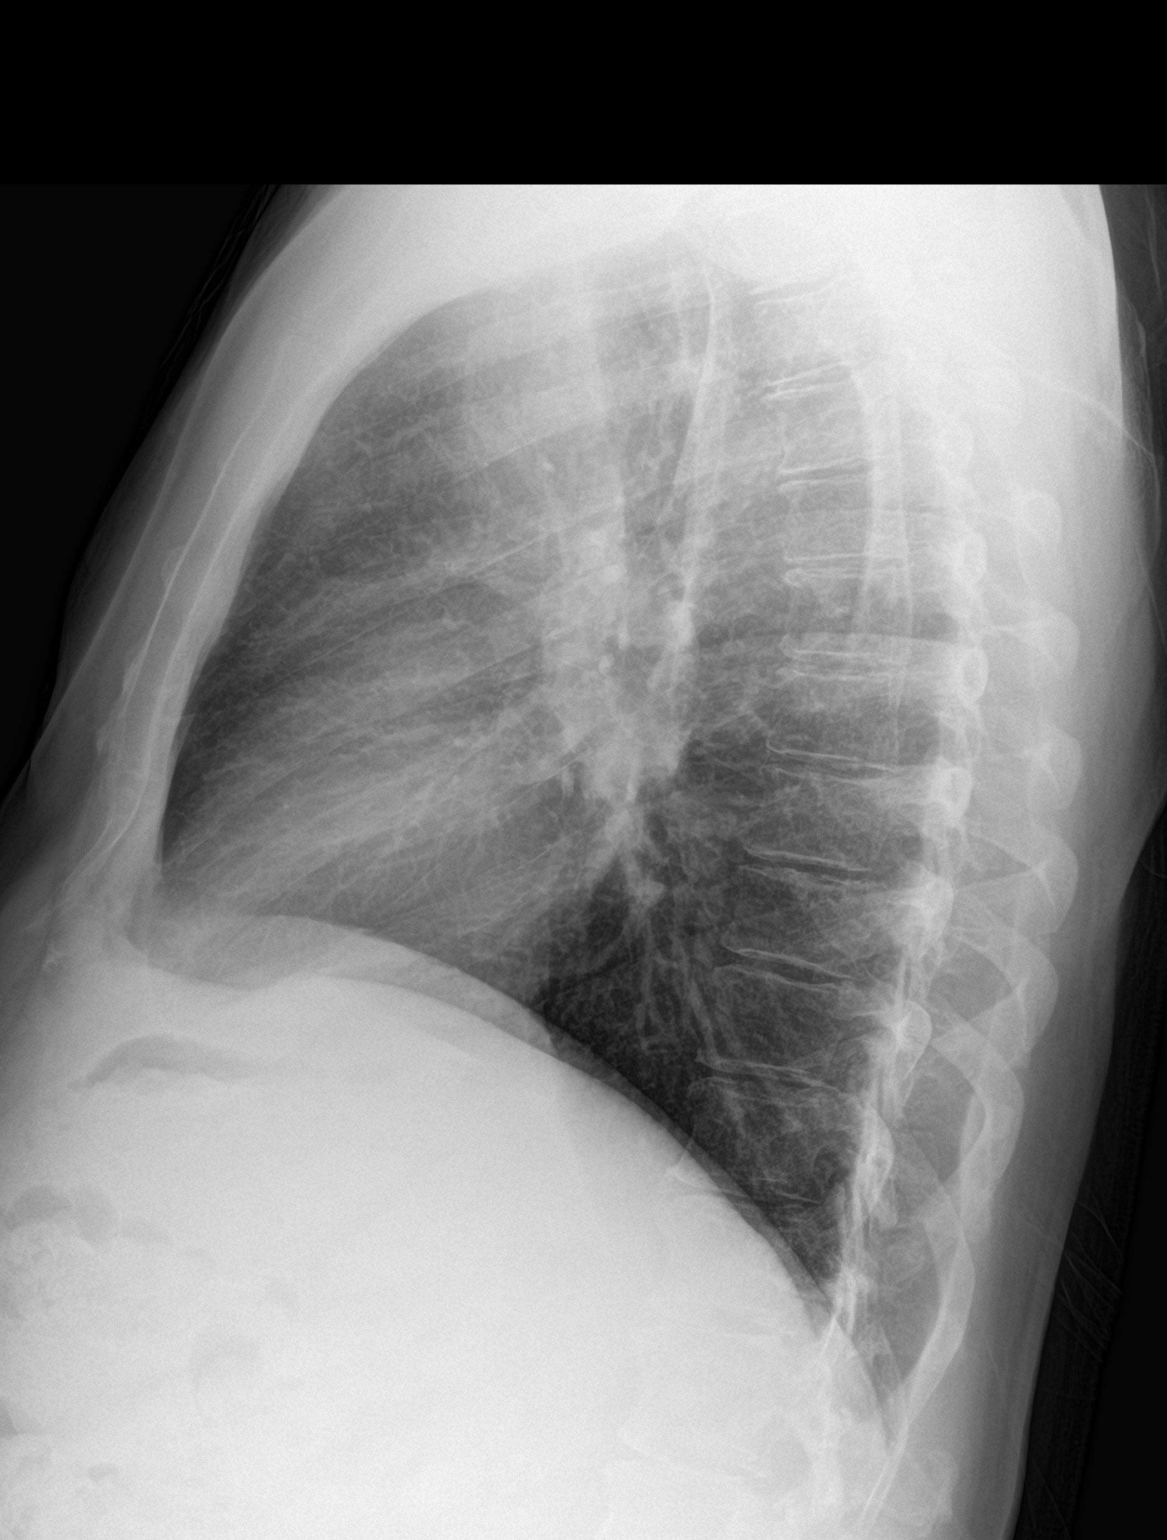

[2 of 2 positions shown; findings below may reference images not displayed]

FINDINGS: The cardiac silhouette, mediastinal and hilar contours are stable.
Known mediastinal and hilar adenopathy. The lungs are clear. No
pneumothorax or pleural effusion. The pulmonary lesions are
difficult to demonstrate.
IMPRESSION: No acute cardiopulmonary findings.

Known mediastinal lymphadenopathy and pulmonary lesions are
difficult to demonstrate.

## 2017-09-12 ENCOUNTER — Encounter (INDEPENDENT_AMBULATORY_CARE_PROVIDER_SITE_OTHER): Payer: Self-pay | Admitting: Orthopaedic Surgery

## 2017-09-12 ENCOUNTER — Ambulatory Visit (INDEPENDENT_AMBULATORY_CARE_PROVIDER_SITE_OTHER): Payer: 59 | Admitting: Orthopaedic Surgery

## 2017-09-12 DIAGNOSIS — Z9889 Other specified postprocedural states: Secondary | ICD-10-CM

## 2017-09-12 NOTE — Progress Notes (Signed)
The patient is now getting close to 3 months status post a right shoulder arthroscopy with significant debridement.  He still going to physical therapy and we have been concerned about postoperative arthrofibrosis in that right shoulder.  I have provided a postoperative subacromial steroid injection.  On exam he still shows limitations with abduction as well as external rotation.  His internal rotation with adduction is also limited.  Notes from therapy show he is making progress.  At this point we will see him back in 4 weeks.  I do feel that he is a perfect candidate for an intra-articular right shoulder steroid injection under direct fluoroscopy by Dr. Ernestina Patches.  If he decides that he would like to have this done sooner he would call us because we would just set him up for that appointment with Dr. Ernestina Patches for a right shoulder steroid  injection.  If not I will see him back in 4 weeks.

## 2017-09-13 ENCOUNTER — Ambulatory Visit: Payer: 59 | Admitting: Physical Therapy

## 2017-09-13 ENCOUNTER — Encounter: Payer: Self-pay | Admitting: Physical Therapy

## 2017-09-13 DIAGNOSIS — M25611 Stiffness of right shoulder, not elsewhere classified: Secondary | ICD-10-CM | POA: Diagnosis not present

## 2017-09-13 DIAGNOSIS — G8929 Other chronic pain: Secondary | ICD-10-CM

## 2017-09-13 DIAGNOSIS — M6281 Muscle weakness (generalized): Secondary | ICD-10-CM

## 2017-09-13 DIAGNOSIS — R293 Abnormal posture: Secondary | ICD-10-CM

## 2017-09-13 DIAGNOSIS — M25511 Pain in right shoulder: Secondary | ICD-10-CM

## 2017-09-13 NOTE — Therapy (Signed)
[place], [place], [address] Phone: [phone]   Fax:  [phone]  Physical Therapy Treatment  Patient Details  Name: Robert Morrison MRN: [id number] Date of Birth: [birth year] Referring Provider: Jean Rosenthal    Encounter Date: 09/13/2017  PT End of Session - 09/13/17 1612    Visit Number  17    Number of Visits  18    Date for PT Re-Evaluation  09/20/17    Authorization Type  [employer]     PT Start Time  1500    PT Stop Time  1545    PT Time Calculation (min)  45 min    Activity Tolerance  Patient tolerated treatment well    Behavior During Therapy  [facility] for tasks assessed/performed       Past Medical History:  Diagnosis Date  . Allergy    takes Claritin daily  . GERD (gastroesophageal reflux disease)    takes OMeprazole daily  . History of bronchitis    around 08  . History of colon polyps    benign  . Hyperlipidemia   . Pneumonia    hx of couple of yrs ago  . PONV (postoperative nausea and vomiting)     Past Surgical History:  Procedure Laterality Date  . COLONOSCOPY  12-05-2009  . COLONOSCOPY    . ELBOW FRACTURE SURGERY Left 1998  . ENDOBRONCHIAL ULTRASOUND Bilateral 12/08/2015   Procedure: ENDOBRONCHIAL ULTRASOUND;  Surgeon: Juanito Doom, MD;  Location: [facility];  Service: Cardiopulmonary;  Laterality: Bilateral;  . MEDIASTINOSCOPY N/A 01/29/2016   Procedure: MEDIASTINOSCOPY;  Surgeon: Melrose Nakayama, MD;  Location: [place];  Service: Thoracic;  Laterality: N/A;  . POLYPECTOMY    . ROTATOR CUFF REPAIR Left 2010    There were no vitals filed for this visit.  Subjective Assessment - 09/13/17 1500    Subjective  "I feel fine just standing and walking. It is when I start to move it around that it hurts."    Currently in Pain?  Yes    Pain Score  0-No pain    Pain Location  Shoulder    Pain Orientation  Right    Pain Descriptors / Indicators  --  stretching    Pain Type  Surgical pain    Pain Radiating Towards  none    Pain Onset  More than a month ago    Pain Frequency  Intermittent    Aggravating Factors   Reaching behind too far    Pain Relieving Factors  rest                       [facility] Adult PT Treatment/Exercise - 09/13/17 0001      Shoulder Exercises: Supine   Other Supine Exercises  foam roller series: ceiling punches, alternating ceiling punches, horizontal adduction/abduction, D2 pattern, backstroke x 10-15 ea      Shoulder Exercises: Pulleys   Other Pulley Exercises  2 min flexion, 2 min scaption, 2 min abduction      Shoulder Exercises: Stretch   Other Shoulder Stretches  rhomboid stretch 3 x 30 sec; lat stretch 2 x 30 sec (crossed arms second set)      Shoulder Exercises: Body Blade   Other Body Blade Exercises  IR/ER at side, elbow flexion palm up and palm down x 2 sets      Manual Therapy   Joint Mobilization  Grade 3,4 posterior and inferior GH joint mobilization,  scapular assistance R shoulder               PT Short Term Goals - 08/30/17 1117      PT SHORT TERM GOAL #1   Title  Patient to demonstrate R shoulder AAROM and AROM as being full and pain free in order to improve gross shoulder mobility and mechanics     Period  Weeks    Status  On-going      PT SHORT TERM GOAL #2   Title  Patient to demonstrate resolutation of postural deficits in order to support healthy shoulder mechanics and reduce compensation patterns     Period  Weeks    Status  Achieved      PT SHORT TERM GOAL #3   Title  Patient to report pain as being 0/10 in R shoulder with all functional tasks and activities in order to improve QOL and functional task tolerance     Baseline  reaching pain increases to about a 1/10    Time  4    Period  Weeks    Status  Partially Met      PT SHORT TERM GOAL #4   Title  Patient to be independent in correct performance of appropriate HEP, to be updated PRN     Time  1     Period  Weeks    Status  Achieved        PT Long Term Goals - 08/30/17 1118      PT LONG TERM GOAL #1   Title  Patient to demonstrate functional strength of 5/5 in all tested muscles in order to show improved strength and stability of R shoulder     Baseline  7/16- flowsheets     Time  8    Period  Weeks    Status  On-going      PT LONG TERM GOAL #2   Title  Patient to be able to perform functional overhead activities without compensation patterns or scapular winging in order to ensure good shoulder mechanics     Baseline  has compensation,  with overhead with shoulder hiking     Time  8    Period  Weeks    Status  On-going      PT LONG TERM GOAL #3   Title  Patient to be able to perform all self-care activities such as dressing/bathing without pain or ROM limitations in order to improve QOL     Period  Weeks    Status  Achieved      PT LONG TERM GOAL #4   Title  Patient to be able to perform all functional daily tasks and activities without pain in order to improve QOL and demonstrate restored shoulder function     Time  8    Period  Weeks    Status  On-going            Plan - 09/13/17 1639    Clinical Impression Statement  Treatment today focused on mobilizations of glenohmeral joint (posterior and inferior) in addition to scapular assistance mobs. Pt completed supine foam roller series which was challenging for the pt. Introduced body blade into treatment (shoulder IR/ER, elbow flexion with palm up and palm down).     PT Treatment/Interventions  ADLs/Self Care Home Management;Biofeedback;Cryotherapy;Electrical Stimulation;Iontophoresis 5m/ml Dexamethasone;Moist Heat;Ultrasound;Functional mobility training;Therapeutic activities;Therapeutic exercise;Balance training;Neuromuscular re-education;Patient/family education;Manual techniques;Scar mobilization;Passive range of motion;Dry needling;Taping    PT Next Visit Plan  continue heat/STM to pec, consider DN.  AAROM/AROM.   scapular assist, scapulohumeral rhythm    PT Home Exercise Plan  Eval: supine flexion AAROM, supine ABD AAROM, standing ER doorway PROM stretch, isometrics, wall walks, seated ABD stretch sleeper stretches X2, and posterior capsuel stretch, rhomboid stretch    Consulted and Agree with Plan of Care  Patient       Patient will benefit from skilled therapeutic intervention in order to improve the following deficits and impairments:  Increased fascial restricitons, Improper body mechanics, Pain, Decreased coordination, Decreased mobility, Decreased scar mobility, Increased muscle spasms, Postural dysfunction, Decreased range of motion, Decreased strength, Hypomobility, Impaired UE functional use, Impaired flexibility  Visit Diagnosis: Stiffness of right shoulder, not elsewhere classified  Chronic right shoulder pain  Abnormal posture  Muscle weakness (generalized)     Problem List Patient Active Problem List   Diagnosis Date Noted  . Status post arthroscopy of right shoulder 07/04/2017  . Impingement syndrome of right shoulder 10/04/2016  . Pulmonary sarcoidosis ([place]) 11/28/2015  . Cough 11/12/2015   Worthy Flank, SPT 09/13/17 4:44 PM   [place], [place], [address] Phone: [phone]   Fax:  [phone]  Name: Robert Morrison MRN: [id number] Date of Birth: 21-May-1955

## 2017-09-19 ENCOUNTER — Encounter: Payer: Self-pay | Admitting: Physical Therapy

## 2017-09-19 ENCOUNTER — Telehealth (INDEPENDENT_AMBULATORY_CARE_PROVIDER_SITE_OTHER): Payer: Self-pay | Admitting: *Deleted

## 2017-09-19 ENCOUNTER — Ambulatory Visit: Payer: 59 | Attending: Orthopaedic Surgery | Admitting: Physical Therapy

## 2017-09-19 ENCOUNTER — Other Ambulatory Visit (INDEPENDENT_AMBULATORY_CARE_PROVIDER_SITE_OTHER): Payer: Self-pay

## 2017-09-19 DIAGNOSIS — M6281 Muscle weakness (generalized): Secondary | ICD-10-CM

## 2017-09-19 DIAGNOSIS — R293 Abnormal posture: Secondary | ICD-10-CM | POA: Diagnosis present

## 2017-09-19 DIAGNOSIS — M25611 Stiffness of right shoulder, not elsewhere classified: Secondary | ICD-10-CM | POA: Diagnosis present

## 2017-09-19 DIAGNOSIS — G8929 Other chronic pain: Secondary | ICD-10-CM | POA: Diagnosis present

## 2017-09-19 DIAGNOSIS — M25511 Pain in right shoulder: Secondary | ICD-10-CM

## 2017-09-19 NOTE — Therapy (Addendum)
[place], [place], [address] Phone: [phone]   Fax:  [phone]  Physical Therapy Treatment / discharge summary  Patient Details  Name: Robert Morrison MRN: [id number] Date of Birth: August 03, 1955 Referring Provider: Jean Rosenthal    Encounter Date: 09/19/2017  PT End of Session - 09/19/17 1658    Visit Number  18    Number of Visits  18    Date for PT Re-Evaluation  09/20/17    Authorization Time Period  07/07/17 to 09/06/17    Activity Tolerance  Patient tolerated treatment well    Behavior During Therapy  [facility] for tasks assessed/performed       Past Medical History:  Diagnosis Date  . Allergy    takes Claritin daily  . GERD (gastroesophageal reflux disease)    takes OMeprazole daily  . History of bronchitis    around 08  . History of colon polyps    benign  . Hyperlipidemia   . Pneumonia    hx of couple of yrs ago  . PONV (postoperative nausea and vomiting)     Past Surgical History:  Procedure Laterality Date  . COLONOSCOPY  12-05-2009  . COLONOSCOPY    . ELBOW FRACTURE SURGERY Left 1998  . ENDOBRONCHIAL ULTRASOUND Bilateral 12/08/2015   Procedure: ENDOBRONCHIAL ULTRASOUND;  Surgeon: Juanito Doom, MD;  [facility];  Service: Cardiopulmonary;  Laterality: Bilateral;  . MEDIASTINOSCOPY N/A 01/29/2016   Procedure: MEDIASTINOSCOPY;  Surgeon: Melrose Nakayama, MD;  Location: [place];  Service: Thoracic;  Laterality: N/A;  . POLYPECTOMY    . ROTATOR CUFF REPAIR Left 2010    There were no vitals filed for this visit.  Subjective Assessment - 09/19/17 1507    Subjective  I feel i get motion then it returns to stiffness.  I think I will get the shot.      Pain Location  Shoulder    Pain Orientation  Right    Pain Descriptors / Indicators  Sore;Sharp stiff,  a little shock wave    Pain Type  Surgical pain    Pain Frequency  Intermittent    Aggravating Factors    reaching behind  to  back pocket,   exercisesdriving every now and then    Pain Relieving Factors  rest    Multiple Pain Sites  No         [facility] PT Assessment - 09/19/17 0001      AROM   Overall AROM Comments  Measured prior to exercise/ manual    Right Shoulder Flexion  110 Degrees 131 post session  pain brief     Right Shoulder ABduction  96 Degrees    Right Shoulder Internal Rotation  55 Degrees 90/90    Right Shoulder External Rotation  28 Degrees 90/90                   [facility] Adult PT Treatment/Exercise - 09/19/17 0001      Self-Care   Self-Care  Other Self-Care Comments    Other Self-Care Comments   extra time spent with PT Vania Rea and patient with duscussion Re plan of care.  patient feels he has plateaued,  He will try to get appointment moved up with Dr.  Ninfa Linden and place his PT on hold.       Shoulder Exercises: Seated   Flexion  5 reps    Abduction  5 reps      Shoulder Exercises: Stretch  Other Shoulder Stretches  rhomboid stretch  shoulder eR stretch      Manual Therapy   Manual Therapy  Soft tissue mobilization    Joint Mobilization  grade 2,3 capsular posterior, inferior and lateral    Soft tissue mobilization  peri scapular,  tissue tender teres    Scapular Mobilization  scapular mobs with movement             PT Education - 09/19/17 1657    Education provided  Yes    Education Details  self care    Methods  Explanation    Comprehension  Verbalized understanding       PT Short Term Goals - 09/19/17 1525      PT SHORT TERM GOAL #1   Title  Patient to demonstrate R shoulder AAROM and AROM as being full and pain free in order to improve gross shoulder mobility and mechanics     Baseline  Limited not painfree    Time  4    Period  Weeks    Status  Not Met      PT SHORT TERM GOAL #2   Title  Patient to demonstrate resolutation of postural deficits in order to support healthy shoulder mechanics and reduce compensation patterns      Time  4    Period  Weeks    Status  Achieved      PT SHORT TERM GOAL #3   Title  Patient to report pain as being 0/10 in R shoulder with all functional tasks and activities in order to improve QOL and functional task tolerance     Baseline  mild except reaching behind  up to 7/10    Time  4    Period  Weeks    Status  On-going      PT SHORT TERM GOAL #4   Title  Patient to be independent in correct performance of appropriate HEP, to be updated PRN     Time  1    Period  Weeks    Status  Achieved        PT Long Term Goals - 09/19/17 1527      PT LONG TERM GOAL #1   Title  Patient to demonstrate functional strength of 5/5 in all tested muscles in order to show improved strength and stability of R shoulder     Baseline  8/05  flow sheets    Time  8    Period  Weeks    Status  On-going      PT LONG TERM GOAL #2   Title  Patient to be able to perform functional overhead activities without compensation patterns or scapular winging in order to ensure good shoulder mechanics     Baseline  has compensation,  with overhead with shoulder hiking     Time  8    Status  On-going      PT LONG TERM GOAL #3   Title  Patient to be able to perform all self-care activities such as dressing/bathing without pain or ROM limitations in order to improve QOL     Baseline  mild pain      PT LONG TERM GOAL #4   Title  Patient to be able to perform all functional daily tasks and activities without pain in order to improve QOL and demonstrate restored shoulder function     Baseline  quick movements painful driving,  reaching behing    Time  8    Period  Weeks    Status  On-going            Plan - 09/19/17 1658    Clinical Impression Statement  Patient feels he is unable to keep the gains he gets when he comes to PT. He will try to move MD appointment date sooner to discuss injection. PT will be on hold until Patient lets [place] know MD's plans.   He was able to increase AROM flexion from 110 to  130 post manual.  pain at end of session 2/10.    PT Next Visit Plan  Continue with HEP.  See what MD says.  Formal PT on hold per Carlus Pavlov PT and patient.  Patient to contact PT to let us know his plan.    PT Home Exercise Plan  Eval: supine flexion AAROM, supine ABD AAROM, standing ER doorway PROM stretch, isometrics, wall walks, seated ABD stretch sleeper stretches X2, and posterior capsuel stretch, rhomboid stretch    Consulted and Agree with Plan of Care  Patient       Patient will benefit from skilled therapeutic intervention in order to improve the following deficits and impairments:     Visit Diagnosis: Stiffness of right shoulder, not elsewhere classified  Chronic right shoulder pain  Abnormal posture  Muscle weakness (generalized)     Problem List Patient Active Problem List   Diagnosis Date Noted  . Status post arthroscopy of right shoulder 07/04/2017  . Impingement syndrome of right shoulder 10/04/2016  . Pulmonary sarcoidosis ([place]) 11/28/2015  . Cough 11/12/2015    HARRIS,KAREN  PTA 09/19/2017, 5:07 PM  [facility] [address] [place], [place], [address] Phone: [phone]   Fax:  [phone]  Name: AGOSTINO GORIN MRN: [id number] Date of Birth: 18-Dec-1955         PHYSICAL THERAPY DISCHARGE SUMMARY  Visits from Start of Care: 18  Current functional level related to goals / functional outcomes: See goals   Remaining deficits: See note    Education / Equipment: HEP, theraband, posture  Plan: Patient agrees to discharge.  Patient goals were partially met. Patient is being discharged due to not returning since the last visit.  ?????         Kristoffer Leamon PT, DPT, LAT, ATC  11/08/17  3:14 PM

## 2017-09-19 NOTE — Telephone Encounter (Signed)
Ok, I put in order for injection

## 2017-09-19 NOTE — Telephone Encounter (Signed)
Pt scheduled for 10/04/17 for shoulder injection.

## 2017-09-21 ENCOUNTER — Ambulatory Visit: Payer: 59 | Admitting: Physical Therapy

## 2017-09-26 NOTE — Telephone Encounter (Signed)
Spoke with patient to let him know that we want to see if this injection will help before we do any sort of surgery again

## 2017-09-26 NOTE — Telephone Encounter (Signed)
Patient left a voicemail needing more information about injection he would be receiving with Dr. Ernestina Patches. He wants to know how this is different than what Dr. Ninfa Linden has been injection in his shoulders and if this will actually help him or if he is still going to have problems.  Patient is requesting a call back from you Please advise [phone]

## 2017-09-27 ENCOUNTER — Encounter: Payer: 59 | Admitting: Physical Therapy

## 2017-09-29 ENCOUNTER — Encounter: Payer: 59 | Admitting: Physical Therapy

## 2017-10-03 ENCOUNTER — Ambulatory Visit (INDEPENDENT_AMBULATORY_CARE_PROVIDER_SITE_OTHER): Payer: 59 | Admitting: Orthopaedic Surgery

## 2017-10-04 ENCOUNTER — Encounter: Payer: 59 | Admitting: Physical Therapy

## 2017-10-04 ENCOUNTER — Encounter (INDEPENDENT_AMBULATORY_CARE_PROVIDER_SITE_OTHER): Payer: Self-pay | Admitting: Physical Medicine and Rehabilitation

## 2017-10-04 ENCOUNTER — Ambulatory Visit (INDEPENDENT_AMBULATORY_CARE_PROVIDER_SITE_OTHER): Payer: 59 | Admitting: Physical Medicine and Rehabilitation

## 2017-10-04 ENCOUNTER — Ambulatory Visit (INDEPENDENT_AMBULATORY_CARE_PROVIDER_SITE_OTHER): Payer: Self-pay

## 2017-10-04 ENCOUNTER — Encounter

## 2017-10-04 DIAGNOSIS — M25511 Pain in right shoulder: Secondary | ICD-10-CM | POA: Diagnosis not present

## 2017-10-04 DIAGNOSIS — G8929 Other chronic pain: Secondary | ICD-10-CM

## 2017-10-04 NOTE — Progress Notes (Signed)
Robert Morrison - 62 y.o. male MRN [id number]  Date of birth: 1955-03-12  Office Visit Note: Visit Date: 10/04/2017 PCP: Shon Baton, MD Referred by: Shon Baton, MD  Subjective: Chief Complaint  Patient presents with  . Right Shoulder - Pain   HPI: Robert Morrison is a 62 year old gentleman 7 or 8 weeks out from arthroscopic shoulder surgery.  He continued problems with range of motion despite aggressive physical therapy and subacromial injection.  We are going to complete a diagnostic and hopefully therapeutic anesthetic arthrogram of the glenohumeral joint.  He will continue follow-up with Dr. Ninfa Linden.  Patient not having much in the way of pain just really reduced range of motion with forward flexion and reaching behind his back.   ROS Otherwise per HPI.  Assessment & Plan: Visit Diagnoses:  1. Chronic right shoulder pain     Plan: Findings:  Intra-articular glenohumeral joint injection with fluoroscopic guidance.  Patient did have some increased range of motion during the anesthetic phase but it was not dramatic.  We will continue with home exercises and follow-up with Dr. Ninfa Linden.    Meds & Orders: No orders of the defined types were placed in this encounter.   Orders Placed This Encounter  Procedures  . Large Joint Inj: R glenohumeral  . XR C-ARM NO REPORT    Follow-up: Return if symptoms worsen or fail to improve.   Procedures: Large Joint Inj: R glenohumeral on 10/04/2017 4:23 PM Indications: pain and diagnostic evaluation Details: 22 G 3.5 in needle, fluoroscopy-guided anteromedial approach  Arthrogram: No  Medications: 3 mL bupivacaine 0.5 %; 80 mg triamcinolone acetonide 40 MG/ML Outcome: tolerated well, no immediate complications  There was excellent flow of contrast producing a partial arthrogram of the glenohumeral joint.  The patient had very mild but somewhat increased range of motion after the injection.  He still was fairly limited. Procedure, treatment  alternatives, risks and benefits explained, specific risks discussed. Consent was given by the patient. Immediately prior to procedure a time out was called to verify the correct patient, procedure, equipment, support staff and site/side marked as required. Patient was prepped and draped in the usual sterile fashion.      No notes on file   Clinical History: No specialty comments available.   He reports that he has never smoked. He has never used smokeless tobacco. No results for input(s): HGBA1C, LABURIC in the last 8760 hours.  Objective:  VS:  HT:    WT:   BMI:     BP:   HR: bpm  TEMP: ( )  RESP:  Physical Exam  Ortho Exam Imaging: No results found.  Past Medical/Family/Surgical/Social History: Medications & Allergies reviewed per EMR, new medications updated. Patient Active Problem List   Diagnosis Date Noted  . Status post arthroscopy of right shoulder 07/04/2017  . Impingement syndrome of right shoulder 10/04/2016  . Pulmonary sarcoidosis ([place]) 11/28/2015  . Cough 11/12/2015   Past Medical History:  Diagnosis Date  . Allergy    takes Claritin daily  . GERD (gastroesophageal reflux disease)    takes OMeprazole daily  . History of bronchitis    around 08  . History of colon polyps    benign  . Hyperlipidemia   . Pneumonia    hx of couple of yrs ago  . PONV (postoperative nausea and vomiting)    Family History  Problem Relation Age of Onset  . Colon polyps Sister   . Colon cancer Neg Hx  Past Surgical History:  Procedure Laterality Date  . COLONOSCOPY  12-05-2009  . COLONOSCOPY    . ELBOW FRACTURE SURGERY Left 1998  . ENDOBRONCHIAL ULTRASOUND Bilateral 12/08/2015   Procedure: ENDOBRONCHIAL ULTRASOUND;  Surgeon: Juanito Doom, MD;  [facility];  Service: Cardiopulmonary;  Laterality: Bilateral;  . MEDIASTINOSCOPY N/A 01/29/2016   Procedure: MEDIASTINOSCOPY;  Surgeon: Melrose Nakayama, MD;  Location: [place];  Service: Thoracic;   Laterality: N/A;  . POLYPECTOMY    . ROTATOR CUFF REPAIR Left 2010   Social History   Occupational History  . Not on file  Tobacco Use  . Smoking status: Never Smoker  . Smokeless tobacco: Never Used  Substance and Sexual Activity  . Alcohol use: Yes    Alcohol/week: 0.0 standard drinks    Comment: rarely  . Drug use: No  . Sexual activity: Not on file

## 2017-10-04 NOTE — Patient Instructions (Signed)

## 2017-10-04 NOTE — Progress Notes (Signed)
 .  Numeric Pain Rating Scale and Functional Assessment Average Pain 2   In the last MONTH (on 0-10 scale) has pain interfered with the following?  1. General activity like being  able to carry out your everyday physical activities such as walking, climbing stairs, carrying groceries, or moving a chair?  Rating(0)    -Dye Allergies.

## 2017-10-06 ENCOUNTER — Encounter: Payer: 59 | Admitting: Physical Therapy

## 2017-10-13 IMAGING — DX DG CHEST 2V
2 series · 2 of 2 positions shown · non-contrast
Comparison: 12/11/2015, 11/20/2015

CLINICAL DATA: Cough for 2-3 months

EXAM:
CHEST  2 VIEW

[chest pa]
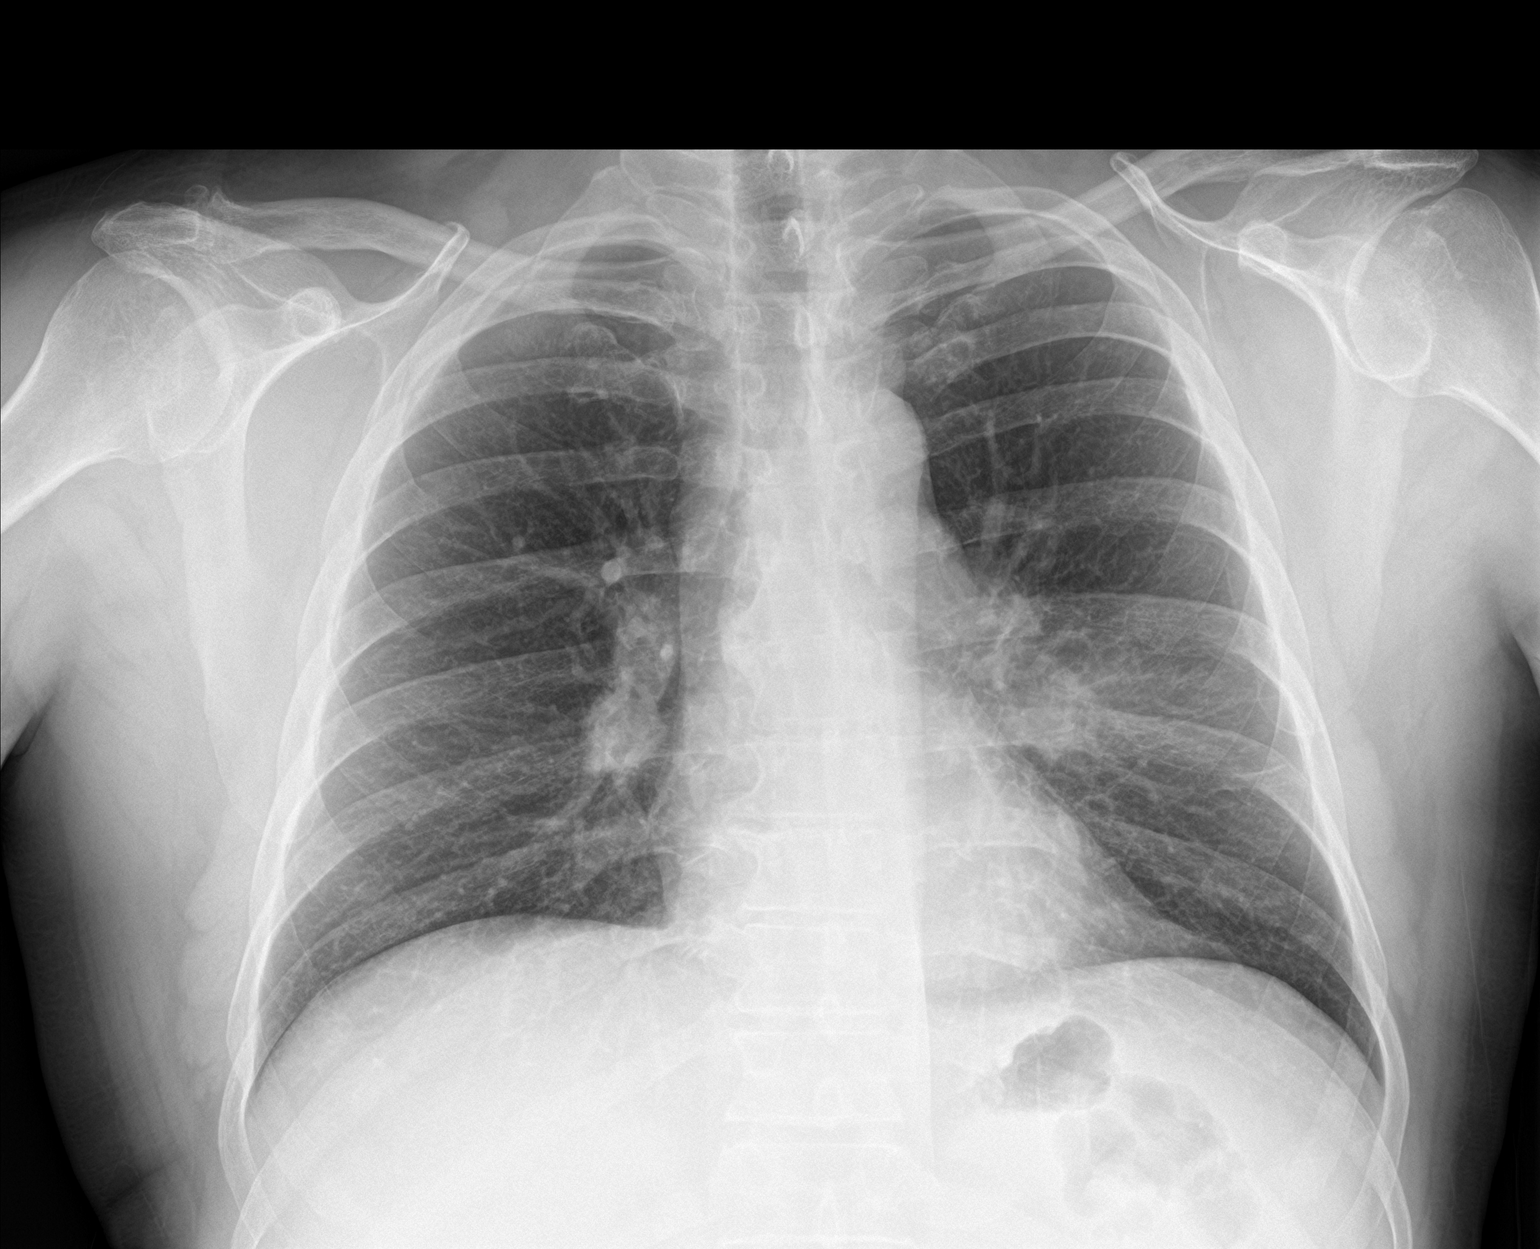

[chest lat]
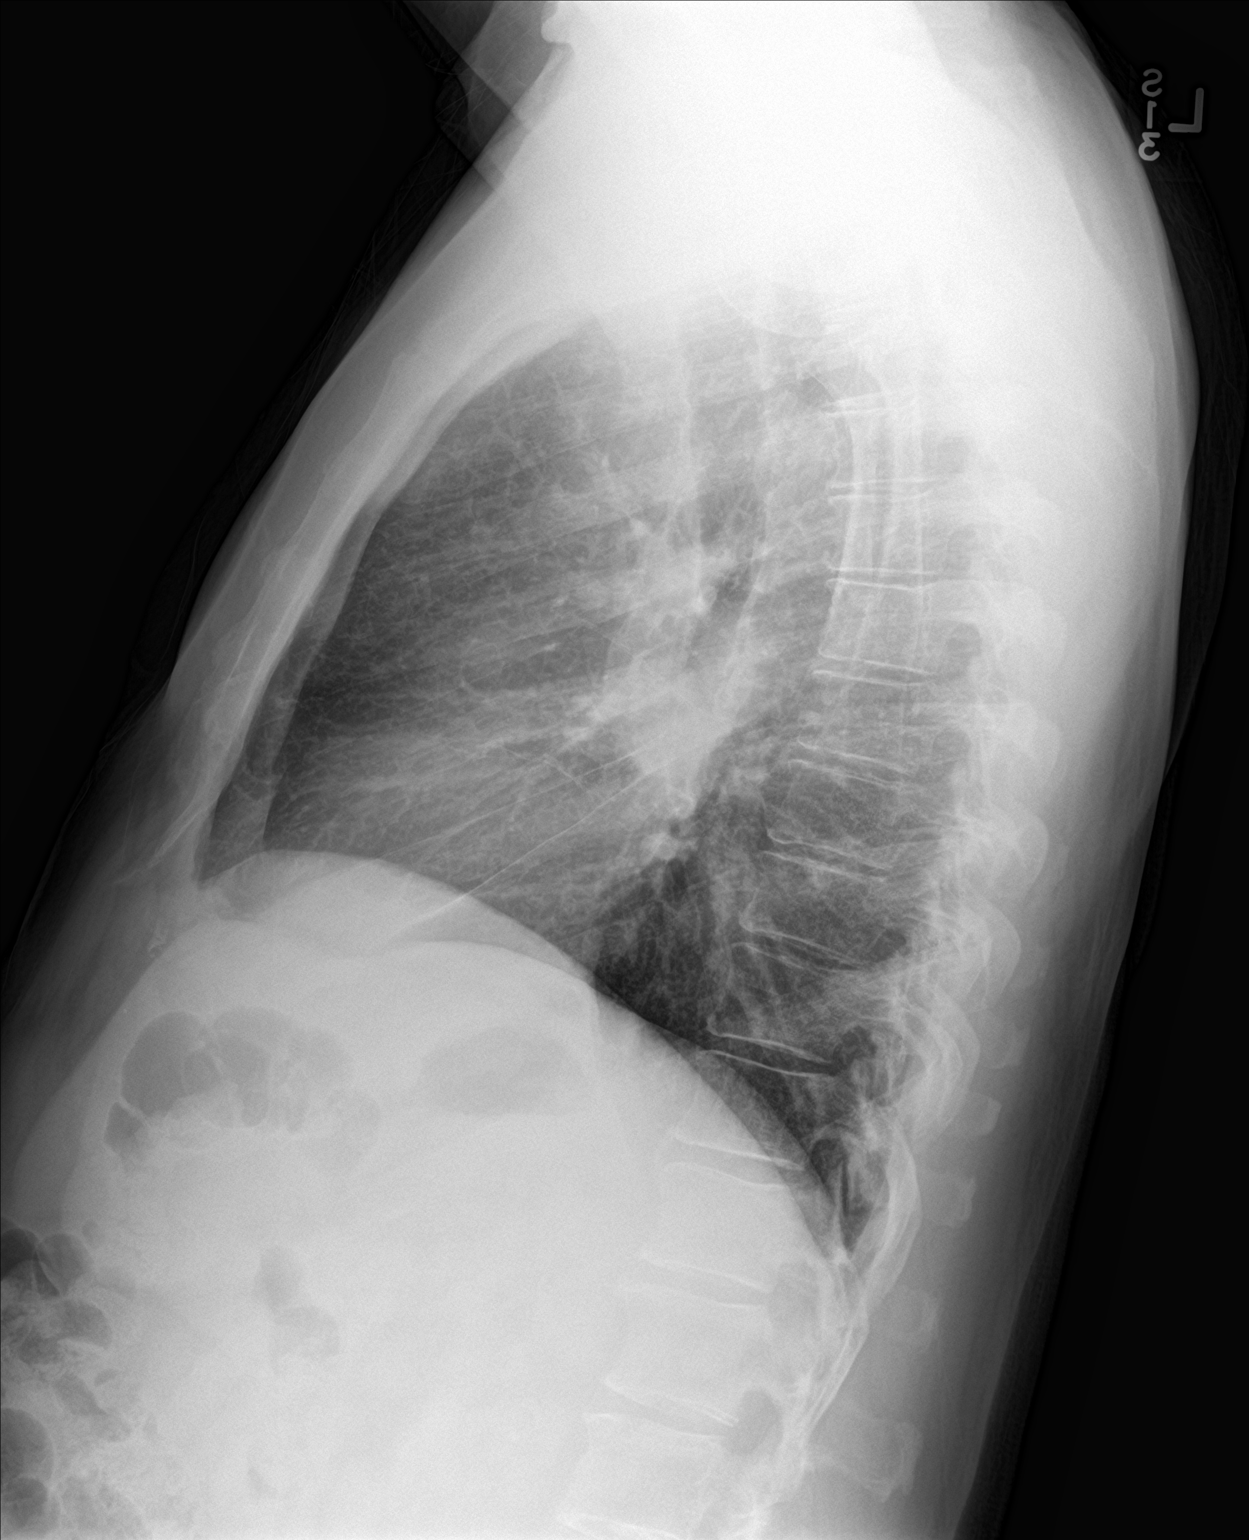

[2 of 2 positions shown; findings below may reference images not displayed]

FINDINGS: Cardiac shadow is within normal limits. Fullness in the hilar
regions is again noted secondary to known lymphadenopathy. The
mediastinal adenopathy is not as well appreciated. The lungs are
clear. No bony abnormality is seen.
IMPRESSION: No acute abnormality is noted.  Stable hilar adenopathy is seen.

## 2017-10-13 MED ORDER — TRIAMCINOLONE ACETONIDE 40 MG/ML IJ SUSP
80.0000 mg | INTRAMUSCULAR | Status: AC | PRN
  Administered 2017-10-04: 80 mg via INTRA_ARTICULAR

## 2017-10-13 MED ORDER — BUPIVACAINE HCL 0.5 % IJ SOLN
3.0000 mL | INTRAMUSCULAR | Status: AC | PRN
  Administered 2017-10-04: 3 mL via INTRA_ARTICULAR

## 2017-10-19 ENCOUNTER — Encounter (INDEPENDENT_AMBULATORY_CARE_PROVIDER_SITE_OTHER): Payer: Self-pay | Admitting: Orthopaedic Surgery

## 2017-10-19 ENCOUNTER — Ambulatory Visit (INDEPENDENT_AMBULATORY_CARE_PROVIDER_SITE_OTHER): Payer: 59 | Admitting: Orthopaedic Surgery

## 2017-10-19 DIAGNOSIS — M25511 Pain in right shoulder: Secondary | ICD-10-CM

## 2017-10-19 NOTE — Progress Notes (Signed)
The patient is now getting close to 4 months status post a right shoulder arthroscopy.  He is getting close to 2 weeks after having an intra-articular injection under fluoroscopy to his right shoulder glenohumeral joint.  He has no shoulder pain at all but his limitations have been with abduction and internal rotation with adduction.  On exam he still lacks full abduction and he still lacks internal rotation with adduction to just up to the lower lumbar spine.  However this is all improved from his last visit.  At this point I like to give him 2 more months of aggressive therapy and home exercise program before considering a manipulation under anesthesia and he agrees with this as well since he is slowly making progress.  All question concerns were answered and addressed.  We will repeat exam in 2 months from now.

## 2017-10-27 DIAGNOSIS — R82998 Other abnormal findings in urine: Secondary | ICD-10-CM | POA: Diagnosis not present

## 2017-10-27 DIAGNOSIS — Z0000 Encounter for general adult medical examination without abnormal findings: Secondary | ICD-10-CM | POA: Diagnosis not present

## 2017-11-07 DIAGNOSIS — Z1389 Encounter for screening for other disorder: Secondary | ICD-10-CM | POA: Diagnosis not present

## 2017-11-07 DIAGNOSIS — Z0000 Encounter for general adult medical examination without abnormal findings: Secondary | ICD-10-CM | POA: Diagnosis not present

## 2017-11-07 DIAGNOSIS — K429 Umbilical hernia without obstruction or gangrene: Secondary | ICD-10-CM | POA: Diagnosis not present

## 2017-11-07 DIAGNOSIS — Z23 Encounter for immunization: Secondary | ICD-10-CM | POA: Diagnosis not present

## 2017-11-07 DIAGNOSIS — D869 Sarcoidosis, unspecified: Secondary | ICD-10-CM | POA: Diagnosis not present

## 2017-11-10 DIAGNOSIS — Z1212 Encounter for screening for malignant neoplasm of rectum: Secondary | ICD-10-CM | POA: Diagnosis not present

## 2017-12-19 ENCOUNTER — Encounter: Payer: Self-pay | Admitting: Nurse Practitioner

## 2017-12-19 ENCOUNTER — Ambulatory Visit (INDEPENDENT_AMBULATORY_CARE_PROVIDER_SITE_OTHER): Payer: 59 | Admitting: Orthopaedic Surgery

## 2017-12-19 ENCOUNTER — Ambulatory Visit (INDEPENDENT_AMBULATORY_CARE_PROVIDER_SITE_OTHER): Payer: 59 | Admitting: Nurse Practitioner

## 2017-12-19 VITALS — BP 138/72 | HR 79 | Ht 66.0 in | Wt 171.6 lb

## 2017-12-19 DIAGNOSIS — R059 Cough, unspecified: Secondary | ICD-10-CM

## 2017-12-19 DIAGNOSIS — R05 Cough: Secondary | ICD-10-CM | POA: Diagnosis not present

## 2017-12-19 MED ORDER — OMEPRAZOLE 40 MG PO CPDR: 40.0000 mg | DELAYED_RELEASE_CAPSULE | Freq: Every day | ORAL | 0 refills | Status: DC

## 2017-12-19 MED ORDER — PREDNISONE 10 MG PO TABS: ORAL_TABLET | ORAL | 0 refills | Status: DC

## 2017-12-19 MED ORDER — AZITHROMYCIN 250 MG PO TABS: ORAL_TABLET | ORAL | 0 refills | Status: DC

## 2017-12-19 NOTE — Assessment & Plan Note (Signed)
Patient Instructions  Azithromycin ordered Prednisone taper ordered Will reorder Prilosec daily Follow up with Dr. [place] Bells in 3 months for routine follow up or sooner if needed

## 2017-12-19 NOTE — Patient Instructions (Addendum)
Azithromycin ordered Prednisone taper ordered Will reorder Prilosec daily Follow up with Dr. [place] Bells in 3 months for routine follow up or sooner if needed

## 2017-12-19 NOTE — Progress Notes (Signed)
@Patient  ID: Robert Morrison, male    DOB: Jul 30, 1955, 62 y.o.   MRN: [id number]  Chief Complaint  Patient presents with  . Cough    Referring provider: Shon Baton, MD  Synopsis: Self-referral in 2017 for cough and was ultimately diagnosed with sarcoidosis based on a mediastinal lymph node recestion in 2017. A CT scan of the chest had been performed prior to that visit showing mediastinal lymphadenopathy. Underwent endobronchial ultrasound-guided fine-needle aspiration and BAL and endobronchial biopsy in October 2017 all of which was negative. Appears to have acid reflux. Cough improved with antacid treatment. Ultimately a mediastinoscopy showed non-caseating granulomas in 01/2016. He was treated with prednisone for 6-8 weeks due to a constellation of symptoms of cough and dyspnea.   HPI 62 year old male never smoker with sarcoidosis followed by Dr. Lake Bells.   Tests; Pulmonary function test: January 2019 ratio normal, FVC 3.3 L 84% predicted, total lung capacity 4.83 L 79% predicted, DLCO 27.3 mL 103%  Pathology: December 2017 4R lymph node from fine-needle aspiration showing granulomatous inflammation  OV 12/19/17 - acute cough Patient presents today with cough. States that he has a slight cough. He reports that the cough has been non productive. He has been under a lot of stress. His sister has been put in hospice. He has been in and out of the hospital over the past couple of weeks visiting her. He has stopped Pepcid and Prilosec. Took a Pepcid the other day with some relief noted. Thought that maybe pepecid was causing itching at one time and wants to avoid it. He denies any fever, shortness of breath, or chest pain.     Allergies  Allergen Reactions  . Oxycodone Other (See Comments)    Other Reaction: Not Assessed  . Famotidine Itching  . Hydrocodone Hives  . Hydrocodone-Acetaminophen Rash    Immunization History  Administered Date(s) Administered  . Influenza Split  10/12/2015, 01/29/2017  . Influenza,inj,Quad PF,6+ Mos 12/06/2016    Past Medical History:  Diagnosis Date  . Allergy    takes Claritin daily  . GERD (gastroesophageal reflux disease)    takes OMeprazole daily  . History of bronchitis    around 08  . History of colon polyps    benign  . Hyperlipidemia   . Pneumonia    hx of couple of yrs ago  . PONV (postoperative nausea and vomiting)     Tobacco History: Social History   Tobacco Use  Smoking Status Never Smoker  Smokeless Tobacco Never Used   Counseling given: Yes   Outpatient Encounter Medications as of 12/19/2017  Medication Sig  . acetaminophen (TYLENOL) 500 MG tablet Take 500 mg by mouth every 6 (six) hours as needed (for pain.).  Marland Kitchen albuterol (PROVENTIL HFA;VENTOLIN HFA) 108 (90 Base) MCG/ACT inhaler Inhale 2 puffs into the lungs every 6 (six) hours as needed for wheezing or shortness of breath.  Marland Kitchen atorvastatin (LIPITOR) 20 MG tablet Take 1 tablet by mouth daily.  Marland Kitchen azithromycin (ZITHROMAX) 250 MG tablet Take 2 tablets (500 mg) on day 1, then take 1 tablet (250 mg) on days 2-5  . famotidine (PEPCID) 20 MG tablet TAKE 1 TABLET BY MOUTH ONCE AT BEDTIME  . omeprazole (PRILOSEC) 40 MG capsule Take 1 capsule (40 mg total) by mouth daily.  . predniSONE (DELTASONE) 10 MG tablet Take 3 tabs for 2 days, then 2 tabs for 2 days, then 1 tab for 2 days, then stop  . [DISCONTINUED] omeprazole (PRILOSEC) 40 MG capsule  Take 40 mg by mouth daily.    Facility-Administered Encounter Medications as of 12/19/2017  Medication  . promethazine-codeine (PHENERGAN with CODEINE) 6.25-10 MG/5ML syrup 5 mL     Review of Systems  Review of Systems  Constitutional: Negative.  Negative for chills and fever.  HENT: Negative.   Respiratory: Positive for cough. Negative for shortness of breath and wheezing.   Cardiovascular: Negative.  Negative for chest pain, palpitations and leg swelling.  Gastrointestinal: Negative.   Allergic/Immunologic:  Negative.   Neurological: Negative.   Psychiatric/Behavioral: Negative.        Physical Exam  BP 138/72 (BP Location: Left Arm, Patient Position: Sitting, Cuff Size: Normal)   Pulse 79   Ht 5\' 6"  (1.676 m)   Wt 171 lb 9.6 oz (77.8 kg)   SpO2 99%   BMI 27.70 kg/m   Wt Readings from Last 5 Encounters:  12/19/17 171 lb 9.6 oz (77.8 kg)  03/31/17 171 lb (77.6 kg)  09/13/16 176 lb (79.8 kg)  05/11/16 178 lb (80.7 kg)  03/19/16 172 lb (78 kg)     Physical Exam  Constitutional: He is oriented to person, place, and time. He appears well-developed and well-nourished. No distress.  Cardiovascular: Normal rate and regular rhythm.  Pulmonary/Chest: Effort normal and breath sounds normal. No respiratory distress. He has no wheezes. He has no rales.  Musculoskeletal: He exhibits no edema.  Neurological: He is alert and oriented to person, place, and time.  Skin: Skin is warm and dry.  Psychiatric: He has a normal mood and affect.  Nursing note and vitals reviewed.     Assessment & Plan:   Cough Patient Instructions  Azithromycin ordered Prednisone taper ordered Will reorder Prilosec daily Follow up with Dr. Lake Bells in 3 months for routine follow up or sooner if needed        Fenton Foy, NP 12/19/2017

## 2017-12-19 NOTE — Progress Notes (Signed)
Reviewed, agree 

## 2017-12-21 ENCOUNTER — Ambulatory Visit (INDEPENDENT_AMBULATORY_CARE_PROVIDER_SITE_OTHER): Payer: 59 | Admitting: Orthopaedic Surgery

## 2018-01-04 ENCOUNTER — Encounter (INDEPENDENT_AMBULATORY_CARE_PROVIDER_SITE_OTHER): Payer: Self-pay | Admitting: Orthopaedic Surgery

## 2018-01-04 ENCOUNTER — Ambulatory Visit (INDEPENDENT_AMBULATORY_CARE_PROVIDER_SITE_OTHER): Payer: 59 | Admitting: Orthopaedic Surgery

## 2018-01-04 DIAGNOSIS — Z9889 Other specified postprocedural states: Secondary | ICD-10-CM | POA: Diagnosis not present

## 2018-01-04 NOTE — Progress Notes (Signed)
HPI: [name] follows up now 6 months status post right shoulder arthroscopy.  He is finished with formal therapy.  He states he has no pain.  He does state that he is aggravated somewhat due to the fact that he is unable to reach up completely with the right arm is is is is dominant arm.  However he states is not doing his home exercise program as he should.  Feels like he is made some progress in the last 2 months.  Review of systems: Please see HPI otherwise negative  Physical exam: General well-developed well-nourished male in no acute distress. Psych: Alert and oriented x3. Right shoulder: Forward flexion actively to approximately 160 degrees active range to either 6570 degrees passively.  Abduction right arm to 110 degrees actively.  He has full range of motion of left shoulder without pain.  Internal rotation is able to reach up into the low lumbar region of the right arm but not this high as with his left arm.  Impression: 6 months status post right shoulder arthroscopy with  Plan: Due to the fact the patient does not have any pain but still has limited range of motion we will have him continue to work on range of motion strengthening the shoulder on his own.  He does not wish for any type of surgical intervention including closed manipulation under anesthesia as he has no pain.  See him back in February check his progress lack of.  He can always return sooner if he has any questions or concerns.

## 2018-01-31 DIAGNOSIS — E7849 Other hyperlipidemia: Secondary | ICD-10-CM | POA: Diagnosis not present

## 2018-01-31 DIAGNOSIS — H6123 Impacted cerumen, bilateral: Secondary | ICD-10-CM | POA: Diagnosis not present

## 2018-01-31 DIAGNOSIS — Z6829 Body mass index (BMI) 29.0-29.9, adult: Secondary | ICD-10-CM | POA: Diagnosis not present

## 2018-03-22 ENCOUNTER — Encounter: Payer: Self-pay | Admitting: Pulmonary Disease

## 2018-03-22 ENCOUNTER — Ambulatory Visit (INDEPENDENT_AMBULATORY_CARE_PROVIDER_SITE_OTHER)
Admission: RE | Admit: 2018-03-22 | Discharge: 2018-03-22 | Disposition: A | Discharge status: Home / Self Care | Payer: 59 | Source: Ambulatory Visit | Attending: Pulmonary Disease | Admitting: Pulmonary Disease

## 2018-03-22 ENCOUNTER — Ambulatory Visit: Payer: 59 | Admitting: Pulmonary Disease

## 2018-03-22 VITALS — BP 144/82 | HR 75 | Ht 66.0 in | Wt 173.0 lb

## 2018-03-22 DIAGNOSIS — K219 Gastro-esophageal reflux disease without esophagitis: Secondary | ICD-10-CM

## 2018-03-22 DIAGNOSIS — R059 Cough, unspecified: Secondary | ICD-10-CM

## 2018-03-22 DIAGNOSIS — D860 Sarcoidosis of lung: Secondary | ICD-10-CM

## 2018-03-22 DIAGNOSIS — R05 Cough: Secondary | ICD-10-CM

## 2018-03-22 NOTE — Progress Notes (Signed)
Subjective:    Patient ID: Robert Morrison, male    DOB: 17-Jan-1956, 63 y.o.   MRN: [id number]  Synopsis: Self-referral in 2017 for cough and was ultimately diagnosed with sarcoidosis based on a mediastinal lymph node recestion in 2017. A CT scan of the chest had been performed prior to that visit showing mediastinal lymphadenopathy. Underwent endobronchial ultrasound-guided fine-needle aspiration and BAL and endobronchial biopsy in October 2017 all of which was negative. Appears to have acid reflux. Cough improved with antacid treatment.  Ultimately a mediastinoscopy showed non-caseating granulomas in 01/2016.    He was treated with prednisone for 6-8 weeks due to a constellation of symptoms of cough and dyspnea.   HPI Chief Complaint  Patient presents with  . Follow-up    pt states he is doing well at this time.    Robert Morrison has been well since the last visit.  He had a cough and a case of bronchitis in November and saw Robert Morrison and was treated with some prednisone and a Z-Pak.  Since then he is not had any trouble.  He denies any trouble breathing.  He says he will have a rare dry cough from time to time.  He denies heartburn symptoms.  Past Medical History:  Diagnosis Date  . Allergy    takes Claritin daily  . GERD (gastroesophageal reflux disease)    takes OMeprazole daily  . History of bronchitis    around 08  . History of colon polyps    benign  . Hyperlipidemia   . Pneumonia    hx of couple of yrs ago  . PONV (postoperative nausea and vomiting)       Review of Systems  Constitutional: Positive for chills and fatigue. Negative for fever.  HENT: Positive for nosebleeds, postnasal drip and rhinorrhea.   Respiratory: Positive for cough, shortness of breath and wheezing.   Cardiovascular: Negative for chest pain, palpitations and leg swelling.       Objective:   Physical Exam Vitals:   03/22/18 1137  BP: (!) 144/82  Pulse: 75  SpO2: 97%  Weight: 173 lb  (78.5 kg)  Height: 5\' 6"  (1.676 m)   RA  Gen: well appearing HENT: OP clear, TM's clear, neck supple PULM: CTA B, normal percussion CV: RRR, no mgr, trace edema GI: BS+, soft, nontender Derm: no cyanosis or rash Psyche: normal mood and affect   CBC    Component Value Date/Time   WBC 10.9 (H) 01/27/2016 1521   RBC 5.50 01/27/2016 1521   HGB 14.5 01/27/2016 1521   HCT 45.0 01/27/2016 1521   PLT 423 (H) 01/27/2016 1521   MCV 81.8 01/27/2016 1521   MCH 26.4 01/27/2016 1521   MCHC 32.2 01/27/2016 1521   RDW 13.9 01/27/2016 1521   LYMPHSABS 1.0 01/13/2016 1330   MONOABS 1.0 01/13/2016 1330   EOSABS 0.0 01/13/2016 1330   BASOSABS 0.0 01/13/2016 1330   Pulmonary function test: January 2019 ratio normal, FVC 3.3 L 84% predicted, total lung capacity 4.83 L 79% predicted, DLCO 27.3 mL 103%  Pathology: December 2017 4R lymph node from fine-needle aspiration showing granulomatous inflammation  Records from his visit in November 2019 with Robert Morrison reviewed where he was seen for acute bronchitis and treated with azithromycin as well as prednisone     Assessment & Plan:  Pulmonary sarcoidosis ([place])  Cough  Gastroesophageal reflux disease, esophagitis presence not specified  Discussion: This has been a stable interval for Robert Morrison.  He has  not had any clinical evidence of worsening sarcoidosis since his diagnosis in 2017.  I typically like to follow patients for about 3 years to make sure there is no evidence of disease progression.  We will check a chest x-ray and a lung function test today.  If those tests are within normal limits then he can follow-up with [place] on an as-needed basis afterwards.  Plan: Pulmonary sarcoidosis: As discussed today, you have very limited stage disease and this is unlikely to progress further We will check a lung function test and chest x-ray today to ensure that there has been no evidence of progression If those tests are within normal limits then  you can follow-up with [place] on an as-needed basis It will still be important for you to have annual eye visits with an optometrist or ophthalmologist so that they can examine your eyes and make sure there is no evidence of sarcoidosis  Gastroesophageal reflux disease: Since she had such mild disease I do not think you need to take acid suppression therapy on a regular basis  We will see you back as needed for chest or respiratory symptoms.   Current Outpatient Medications:  .  acetaminophen (TYLENOL) 500 MG tablet, Take 500 mg by mouth every 6 (six) hours as needed (for pain.)., Disp: , Rfl:  .  albuterol (PROVENTIL HFA;VENTOLIN HFA) 108 (90 Base) MCG/ACT inhaler, Inhale 2 puffs into the lungs every 6 (six) hours as needed for wheezing or shortness of breath., Disp: 1 Inhaler, Rfl: 6 .  atorvastatin (LIPITOR) 20 MG tablet, Take 1 tablet by mouth daily., Disp: , Rfl: 3  Current Facility-Administered Medications:  .  promethazine-codeine (PHENERGAN with CODEINE) 6.25-10 MG/5ML syrup 5 mL, 5 mL, Oral, Q6H PRN, Juanito Doom, MD

## 2018-03-22 NOTE — Addendum Note (Signed)
Addended by: Valerie Salts on: 03/22/2018 12:05 PM   Modules accepted: Orders

## 2018-03-22 NOTE — Patient Instructions (Signed)
Pulmonary sarcoidosis: As discussed today, you have very limited stage disease and this is unlikely to progress further We will check a lung function test and chest x-ray today to ensure that there has been no evidence of progression If those tests are within normal limits then you can follow-up with [place] on an as-needed basis It will still be important for you to have annual eye visits with an optometrist or ophthalmologist so that they can examine your eyes and make sure there is no evidence of sarcoidosis  Gastroesophageal reflux disease: Since she had such mild disease I do not think you need to take acid suppression therapy on a regular basis  We will see you back as needed for chest or respiratory symptoms.

## 2018-03-29 ENCOUNTER — Ambulatory Visit (INDEPENDENT_AMBULATORY_CARE_PROVIDER_SITE_OTHER): Payer: 59 | Admitting: Pulmonary Disease

## 2018-03-29 DIAGNOSIS — D860 Sarcoidosis of lung: Secondary | ICD-10-CM

## 2018-03-29 LAB — PULMONARY FUNCTION TEST
DL/VA % pred: 122 %
DL/VA: 5.2 ml/min/mmHg/L
DLCO unc % pred: 103 %
DLCO unc: 24.14 ml/min/mmHg
FEF 25-75 Post: 4.57 L/sec
FEF 25-75 Pre: 3.83 L/sec
FEF2575-%Change-Post: 19 %
FEF2575-%Pred-Post: 193 %
FEF2575-%Pred-Pre: 161 %
FEV1-%Change-Post: 4 %
FEV1-%Pred-Post: 99 %
FEV1-%Pred-Pre: 95 %
FEV1-PRE: 2.79 L
FEV1-Post: 2.91 L
FEV1FVC-%CHANGE-POST: 2 %
FEV1FVC-%Pred-Pre: 117 %
FEV6-%CHANGE-POST: 1 %
FEV6-%Pred-Post: 87 %
FEV6-%Pred-Pre: 85 %
FEV6-PRE: 3.17 L
FEV6-Post: 3.22 L
FEV6FVC-%Pred-Post: 105 %
FEV6FVC-%Pred-Pre: 105 %
FVC-%Change-Post: 1 %
FVC-%Pred-Post: 82 %
FVC-%Pred-Pre: 81 %
FVC-Post: 3.22 L
FVC-Pre: 3.17 L
Post FEV1/FVC ratio: 91 %
Post FEV6/FVC ratio: 100 %
Pre FEV1/FVC ratio: 88 %
Pre FEV6/FVC Ratio: 100 %
RV % pred: 88 %
RV: 1.85 L
TLC % pred: 83 %
TLC: 5.08 L

## 2018-03-29 NOTE — Progress Notes (Signed)
PFT completed today.Katie Welchel,CMA  

## 2018-03-30 DIAGNOSIS — N390 Urinary tract infection, site not specified: Secondary | ICD-10-CM | POA: Diagnosis not present

## 2018-03-30 DIAGNOSIS — M25562 Pain in left knee: Secondary | ICD-10-CM | POA: Diagnosis not present

## 2018-03-30 DIAGNOSIS — R350 Frequency of micturition: Secondary | ICD-10-CM | POA: Diagnosis not present

## 2018-03-30 DIAGNOSIS — N183 Chronic kidney disease, stage 3 (moderate): Secondary | ICD-10-CM | POA: Diagnosis not present

## 2018-04-05 ENCOUNTER — Ambulatory Visit (INDEPENDENT_AMBULATORY_CARE_PROVIDER_SITE_OTHER): Payer: 59 | Admitting: Orthopaedic Surgery

## 2018-04-05 DIAGNOSIS — Z9889 Other specified postprocedural states: Secondary | ICD-10-CM

## 2018-04-05 DIAGNOSIS — M25562 Pain in left knee: Secondary | ICD-10-CM

## 2018-04-05 NOTE — Progress Notes (Signed)
Office Visit Note   Patient: Robert Morrison           Date of Birth: 01/10/1956           MRN: [id number] Visit Date: 04/05/2018              Requested by: Shon Baton, [address], [place] [address] PCP: Shon Baton, MD   Assessment & Plan: Visit Diagnoses:  1. Status post arthroscopy of right shoulder   2. Acute pain of left knee     Plan: As far as the shoulders goes, he is satisfied to the point we will see him back just as needed.  From a knee standpoint, if he continues to have problems with the knee and it does not feel better with time I would order an MRI to rule out a meniscal tear.  All question concerns were answered and addressed.  Follow-up otherwise be as needed.  Follow-Up Instructions: Return if symptoms worsen or fail to improve.   Orders:  No orders of the defined types were placed in this encounter.  No orders of the defined types were placed in this encounter.     Procedures: No procedures performed   Clinical Data: No additional findings.   Subjective: Chief Complaint  Patient presents with  . Right Shoulder - Follow-up  . Left Knee - Pain, Injury  Patient comes in today as follow-up mainly for his right and left shoulders but he has an acute injury to his left knee that happened in January when he got twisted up with his dog and fell backwards.  He had x-rays just last week of his left knee done elsewhere and he has them with him for me to review today.  He points the medial joint line is source of his pain.  He feels a lot better this week than it did over the last few weeks.  He reports pain with mobility types of activities and with pivoting but no locking and catching.  HPI  Review of Systems He currently denies any headache, chest pain, shortness of breath, fever, chills, nausea, vomiting.  Objective: Vital Signs: There were no vitals taken for this visit.  Physical Exam Is alert and orient x3 and in no acute  distress Ortho Exam Examination of his left knee shows no effusion.  He does have pain in the medial compartment when I rotate his tibia on his femur coming from a flexed to an extended position but not a true McMurray sign.  He does have some slight pain over the medial collateral ligament.  His lateral compartment is intact.  His knee is ligamentously stable with full range of motion and no effusion.  Examination of both shoulder shows full range of motion of the left shoulder.  He still lacks full terminal forward flexion on the right side but both shoulders are pain-free. Specialty Comments:  No specialty comments available.  Imaging: No results found. 2 views of the left knee are independently reviewed and show no acute findings with well-maintained joint space. Is well-maintained as well.  PMFS History: Patient Active Problem List   Diagnosis Date Noted  . Status post arthroscopy of right shoulder 07/04/2017  . Impingement syndrome of right shoulder 10/04/2016  . Pulmonary sarcoidosis ([place]) 11/28/2015  . Cough 11/12/2015   Past Medical History:  Diagnosis Date  . Allergy    takes Claritin daily  . GERD (gastroesophageal reflux disease)    takes OMeprazole daily  . History  of bronchitis    around 08  . History of colon polyps    benign  . Hyperlipidemia   . Pneumonia    hx of couple of yrs ago  . PONV (postoperative nausea and vomiting)     Family History  Problem Relation Age of Onset  . Colon polyps Sister   . Colon cancer Neg Hx     Past Surgical History:  Procedure Laterality Date  . COLONOSCOPY  12-05-2009  . COLONOSCOPY    . ELBOW FRACTURE SURGERY Left 1998  . ENDOBRONCHIAL ULTRASOUND Bilateral 12/08/2015   Procedure: ENDOBRONCHIAL ULTRASOUND;  Surgeon: Juanito Doom, MD;  Location: [facility];  Service: Cardiopulmonary;  Laterality: Bilateral;  . MEDIASTINOSCOPY N/A 01/29/2016   Procedure: MEDIASTINOSCOPY;  Surgeon: Melrose Nakayama, MD;  Location:  [facility];  Service: Thoracic;  Laterality: N/A;  . POLYPECTOMY    . ROTATOR CUFF REPAIR Left 2010   Social History   Occupational History  . Not on file  Tobacco Use  . Smoking status: Never Smoker  . Smokeless tobacco: Never Used  Substance and Sexual Activity  . Alcohol use: Yes    Alcohol/week: 0.0 standard drinks    Comment: rarely  . Drug use: No  . Sexual activity: Not on file

## 2018-04-24 ENCOUNTER — Ambulatory Visit (INDEPENDENT_AMBULATORY_CARE_PROVIDER_SITE_OTHER): Payer: 59 | Admitting: Orthopaedic Surgery

## 2018-04-24 DIAGNOSIS — M25562 Pain in left knee: Secondary | ICD-10-CM | POA: Diagnosis not present

## 2018-04-24 NOTE — Progress Notes (Signed)
The patient is well-known to me.  He comes in with continued left knee pain that is been going on after he had a twisting injury to his left knee.  He has never had previous knee problems before.  He hurts along the medial joint line.  We have x-rayed his knee already and it showed well-maintained joint space.  He is even had a steroid injection and that has not helped.  He is worked on activity modification and taken anti-inflammatories.  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  He still has pain with locking and catching in his left knee and now conservative treatment is failing.  On examination of his left knee there is no gross deformity at all.  There is a slight effusion.  He has a positive Murray sign to the medial compartment of his knee.  He deftly hurts along the medial collateral ligament as well.  He actually does have some lateral pain as well.  His Lockman's is negative.  I looked at his plain films again that we took earlier of his left knee and it does show well-maintained joint space.  At this point given the failure conservative treatment and MRI is warranted of his left knee to rule out a meniscal tear.  This also is needed based on his failure of conservative treatment and the clinical signs and symptoms and clinical exam.  We will see him back after the MRI is obtained of his left knee

## 2018-04-25 ENCOUNTER — Other Ambulatory Visit (INDEPENDENT_AMBULATORY_CARE_PROVIDER_SITE_OTHER): Payer: Self-pay

## 2018-04-25 DIAGNOSIS — G8929 Other chronic pain: Secondary | ICD-10-CM

## 2018-04-25 DIAGNOSIS — M25562 Pain in left knee: Principal | ICD-10-CM

## 2018-05-04 ENCOUNTER — Ambulatory Visit
Admission: RE | Admit: 2018-05-04 | Discharge: 2018-05-04 | Disposition: A | Discharge status: Home / Self Care | Payer: 59 | Source: Ambulatory Visit | Attending: Orthopaedic Surgery | Admitting: Orthopaedic Surgery

## 2018-05-04 ENCOUNTER — Other Ambulatory Visit: Payer: Self-pay

## 2018-05-04 DIAGNOSIS — G8929 Other chronic pain: Secondary | ICD-10-CM

## 2018-05-04 DIAGNOSIS — M25562 Pain in left knee: Secondary | ICD-10-CM | POA: Diagnosis not present

## 2018-05-08 DIAGNOSIS — K429 Umbilical hernia without obstruction or gangrene: Secondary | ICD-10-CM | POA: Diagnosis not present

## 2018-05-08 DIAGNOSIS — D869 Sarcoidosis, unspecified: Secondary | ICD-10-CM | POA: Diagnosis not present

## 2018-05-08 DIAGNOSIS — M25562 Pain in left knee: Secondary | ICD-10-CM | POA: Diagnosis not present

## 2018-05-09 ENCOUNTER — Telehealth (INDEPENDENT_AMBULATORY_CARE_PROVIDER_SITE_OTHER): Payer: Self-pay | Admitting: Orthopaedic Surgery

## 2018-05-09 NOTE — Telephone Encounter (Signed)
Patient calling for MRI results.

## 2018-05-09 NOTE — Telephone Encounter (Signed)
I spoke with him and went over his knee MRI.

## 2018-05-09 NOTE — Telephone Encounter (Signed)
New Message  Pt verbalized wanting to know about his MRI results.  Please f/u with pt

## 2018-06-22 ENCOUNTER — Other Ambulatory Visit: Payer: Self-pay

## 2018-06-22 ENCOUNTER — Encounter: Payer: Self-pay | Admitting: Orthopaedic Surgery

## 2018-06-22 ENCOUNTER — Ambulatory Visit: Payer: Self-pay

## 2018-06-22 ENCOUNTER — Ambulatory Visit: Payer: 59 | Admitting: Orthopaedic Surgery

## 2018-06-22 DIAGNOSIS — M542 Cervicalgia: Secondary | ICD-10-CM | POA: Diagnosis not present

## 2018-06-22 DIAGNOSIS — M792 Neuralgia and neuritis, unspecified: Secondary | ICD-10-CM

## 2018-06-22 MED ORDER — METHYLPREDNISOLONE 4 MG PO TABS: ORAL_TABLET | ORAL | 0 refills | Status: DC

## 2018-06-22 NOTE — Progress Notes (Signed)
Office Visit Note   Patient: Robert Morrison           Date of Birth: 1955/03/14           MRN: [id number] Visit Date: 06/22/2018              Requested by: Shon Baton, [address], [place] [address] PCP: Shon Baton, MD   Assessment & Plan: Visit Diagnoses:  1. Neck pain   2. Radicular pain in right arm     Plan: At this point I do feel the patient would benefit from physical therapy on her cervical spine with traction and other modalities to see if we can calm down the radicular symptoms that he is having.  I am also going to start a 6-day steroid taper.  All question concerns were answered and addressed.  We will see him back in about 4 weeks to see how he is doing overall.  Obviously if any of his radicular symptoms worsen I would need to obtain an MRI.  Follow-Up Instructions: Return in about 4 weeks (around 07/20/2018).   Orders:  Orders Placed This Encounter  Procedures  . XR Cervical Spine 2 or 3 views   Meds ordered this encounter  Medications  . methylPREDNISolone (MEDROL) 4 MG tablet    Sig: Medrol dose pack. Take as instructed    Dispense:  21 tablet    Refill:  0      Procedures: No procedures performed   Clinical Data: No additional findings.   Subjective: Chief Complaint  Patient presents with  . Right Shoulder - Follow-up  . Right Hand - Tingling  The patient is well-known to me.  He comes in there with a new issue.  For the last 1 to 2 months he has been having right parascapular shoulder pain but also numbness and tingling in his right dominant hand.  He has never injured his neck before.  He did demonstrate in the office today how he can hold his head tilted to the right side and with his chin back his hands and gets numb right after that.  He says his shoulder is doing well from his arthroscopic surgery performed in the past but his pain is certainly different and the numbness and tingling is certainly new and different.  He says if  he is sitting certain ways his arm and hand get numb especially in the hand.  He does not describe signs and symptoms of carpal tunnel syndrome though.  He does not wake him up at night.  He does not do any activities with the wrist in a palmar flexed position.  HPI  Review of Systems He currently denies any headache, chest pain, shortness of breath, fever, chills, nausea, vomiting  Objective: Vital Signs: There were no vitals taken for this visit.  Physical Exam He is alert and orient x3 and in no acute distress Ortho Exam Examination of his right upper extremity shows no muscle atrophy at all.  He has subjective numbness around his hand once he holds his head in a positive Spurling's sign and Spurling's position to the right side.  He does have parascapular pain to palpation and a trigger point in the trapezius area.  He has a negative Phalen's and Tinel sign at the transverse carpal ligament or the cubital tunnel. Specialty Comments:  No specialty comments available.  Imaging: Xr Cervical Spine 2 Or 3 Views  Result Date: 06/22/2018 2 views of the cervical spine show  no acute deformities.  The overall alignment is well-maintained.  There is minimal arthritic findings.  Disc heights are well-maintained.    PMFS History: Patient Active Problem List   Diagnosis Date Noted  . Status post arthroscopy of right shoulder 07/04/2017  . Impingement syndrome of right shoulder 10/04/2016  . Pulmonary sarcoidosis ([place]) 11/28/2015  . Cough 11/12/2015   Past Medical History:  Diagnosis Date  . Allergy    takes Claritin daily  . GERD (gastroesophageal reflux disease)    takes OMeprazole daily  . History of bronchitis    around 08  . History of colon polyps    benign  . Hyperlipidemia   . Pneumonia    hx of couple of yrs ago  . PONV (postoperative nausea and vomiting)     Family History  Problem Relation Age of Onset  . Colon polyps Sister   . Colon cancer Neg Hx     Past Surgical  History:  Procedure Laterality Date  . COLONOSCOPY  12-05-2009  . COLONOSCOPY    . ELBOW FRACTURE SURGERY Left 1998  . ENDOBRONCHIAL ULTRASOUND Bilateral 12/08/2015   Procedure: ENDOBRONCHIAL ULTRASOUND;  Surgeon: Juanito Doom, MD;  Location: [facility];  Service: Cardiopulmonary;  Laterality: Bilateral;  . MEDIASTINOSCOPY N/A 01/29/2016   Procedure: MEDIASTINOSCOPY;  Surgeon: Melrose Nakayama, MD;  Location: [facility];  Service: Thoracic;  Laterality: N/A;  . POLYPECTOMY    . ROTATOR CUFF REPAIR Left 2010   Social History   Occupational History  . Not on file  Tobacco Use  . Smoking status: Never Smoker  . Smokeless tobacco: Never Used  Substance and Sexual Activity  . Alcohol use: Yes    Alcohol/week: 0.0 standard drinks    Comment: rarely  . Drug use: No  . Sexual activity: Not on file

## 2018-06-26 DIAGNOSIS — R293 Abnormal posture: Secondary | ICD-10-CM | POA: Diagnosis not present

## 2018-06-26 DIAGNOSIS — M79601 Pain in right arm: Secondary | ICD-10-CM | POA: Diagnosis not present

## 2018-06-26 DIAGNOSIS — M542 Cervicalgia: Secondary | ICD-10-CM | POA: Diagnosis not present

## 2018-06-29 DIAGNOSIS — M79601 Pain in right arm: Secondary | ICD-10-CM | POA: Diagnosis not present

## 2018-06-29 DIAGNOSIS — M542 Cervicalgia: Secondary | ICD-10-CM | POA: Diagnosis not present

## 2018-06-29 DIAGNOSIS — R293 Abnormal posture: Secondary | ICD-10-CM | POA: Diagnosis not present

## 2018-07-03 DIAGNOSIS — M542 Cervicalgia: Secondary | ICD-10-CM | POA: Diagnosis not present

## 2018-07-03 DIAGNOSIS — R293 Abnormal posture: Secondary | ICD-10-CM | POA: Diagnosis not present

## 2018-07-03 DIAGNOSIS — M79601 Pain in right arm: Secondary | ICD-10-CM | POA: Diagnosis not present

## 2018-07-11 DIAGNOSIS — M79601 Pain in right arm: Secondary | ICD-10-CM | POA: Diagnosis not present

## 2018-07-11 DIAGNOSIS — R293 Abnormal posture: Secondary | ICD-10-CM | POA: Diagnosis not present

## 2018-07-11 DIAGNOSIS — M542 Cervicalgia: Secondary | ICD-10-CM | POA: Diagnosis not present

## 2018-07-24 ENCOUNTER — Encounter: Payer: Self-pay | Admitting: Orthopaedic Surgery

## 2018-07-24 ENCOUNTER — Other Ambulatory Visit: Payer: Self-pay

## 2018-07-24 ENCOUNTER — Ambulatory Visit (INDEPENDENT_AMBULATORY_CARE_PROVIDER_SITE_OTHER): Payer: 59 | Admitting: Orthopaedic Surgery

## 2018-07-24 DIAGNOSIS — M542 Cervicalgia: Secondary | ICD-10-CM | POA: Diagnosis not present

## 2018-07-24 DIAGNOSIS — M792 Neuralgia and neuritis, unspecified: Secondary | ICD-10-CM | POA: Diagnosis not present

## 2018-07-24 DIAGNOSIS — M4807 Spinal stenosis, lumbosacral region: Secondary | ICD-10-CM

## 2018-07-24 NOTE — Progress Notes (Signed)
The patient is still having significant right-sided upper extremity radicular symptoms with numbness and tingling and weakness.  He has been having right-sided neck pain.  X-rays did show some degenerative disc disease of the cervical spine and we sent him to physical therapy and put him on anti-inflammatories and a steroid.  Physical therapy is helped in terms of decreasing some of the pain in his neck and decreasing his stiffness but he still has significant numbness and tingling weakness going down his right arm.  On exam he does have weak triceps on the right side.  He still has a significantly positive Spurling sign to the right side.  He has numbness going down his right arm into the hand.  Given the failure of conservative treatment including physical therapy, time, anti-inflammatories and an oral steroid and MRI is warranted to rule out nerve compression.  This is due to the weakness we are still listening from him on exam and the numbness and tingling.  I do feel is medically warranted at this point given the failure of all forms of conservative treatment including physical therapy.  We will see him back once the MRI is obtained.  All question concerns were answered and addressed.

## 2018-08-15 ENCOUNTER — Ambulatory Visit
Admission: RE | Admit: 2018-08-15 | Discharge: 2018-08-15 | Disposition: A | Discharge status: Home / Self Care | Payer: 59 | Source: Ambulatory Visit | Attending: Orthopaedic Surgery | Admitting: Orthopaedic Surgery

## 2018-08-15 ENCOUNTER — Other Ambulatory Visit: Payer: Self-pay

## 2018-08-15 DIAGNOSIS — M4807 Spinal stenosis, lumbosacral region: Secondary | ICD-10-CM

## 2018-08-21 ENCOUNTER — Encounter: Payer: Self-pay | Admitting: Orthopaedic Surgery

## 2018-08-21 ENCOUNTER — Other Ambulatory Visit: Payer: Self-pay

## 2018-08-21 ENCOUNTER — Ambulatory Visit (INDEPENDENT_AMBULATORY_CARE_PROVIDER_SITE_OTHER): Payer: 59 | Admitting: Orthopaedic Surgery

## 2018-08-21 DIAGNOSIS — M542 Cervicalgia: Secondary | ICD-10-CM | POA: Diagnosis not present

## 2018-08-21 NOTE — Progress Notes (Signed)
The patient comes in today to go over an MRI of his cervical spine.  He is very well-known to me.  He 63 years old and had a significantly positive Spurling sign to the right side.  Certain positions with holding his neck would cause significant pain rating down his right arm as well as numbness and tingling with no weakness.  He says recently he has been feeling significantly better but we had sent him for the MRI because the symptoms were at their worst.  On exam he still has excellent strength in his bilateral upper extremities that is 5 out of 5.  There is no significant numbness and tingling today but he still has a positive Spurling sign to the right side.  The MRI of the cervical spine does show significant stenosis at C5-C6 and C6-C7.  Also a little bit just below that.  Is still left in the right side but more severe to the right side at C6-C7.  This is from a combination of arthritis and degenerative disc changes.  I showed him a neck model explained in detail what this means.  Right now since he is significantly better we will just can watch this.  All question concerns were answered and addressed.  My next step would be sending him for considering a cervical injection or we can always send him to a spine specialist for surgical evaluation.

## 2019-02-26 ENCOUNTER — Telehealth: Payer: Self-pay | Admitting: Nurse Practitioner

## 2019-02-26 NOTE — Telephone Encounter (Signed)
Per last AVS- no need for omeprazole on regular basis and pt not seen since Feb 2020  Called and spoke with pharmacist and notified needs to deny rx and tell pt to call for appt or f/u with PCP

## 2019-03-01 ENCOUNTER — Ambulatory Visit: Payer: Medicare HMO | Admitting: Podiatry

## 2019-03-01 ENCOUNTER — Other Ambulatory Visit: Payer: Self-pay

## 2019-03-01 ENCOUNTER — Ambulatory Visit (INDEPENDENT_AMBULATORY_CARE_PROVIDER_SITE_OTHER): Payer: Medicare HMO

## 2019-03-01 ENCOUNTER — Encounter: Payer: Self-pay | Admitting: Podiatry

## 2019-03-01 DIAGNOSIS — M722 Plantar fascial fibromatosis: Secondary | ICD-10-CM | POA: Diagnosis not present

## 2019-03-01 DIAGNOSIS — M79671 Pain in right foot: Secondary | ICD-10-CM | POA: Diagnosis not present

## 2019-03-01 NOTE — Patient Instructions (Signed)

## 2019-03-01 NOTE — Progress Notes (Signed)
Subjective:   Patient ID: Robert Morrison, male   DOB: 64 y.o.   MRN: [id number]   HPI Patient presents with severe discomfort in the direct plantar aspect of the right heel at the insertion of the tendon into the calcaneus.  Patient states that does not remember specifically injury and states it is been hurting him for a while and he has been trying to remember retired [employer]   Review of Systems  All other systems reviewed and are negative.       Objective:  Physical Exam Vitals and nursing note reviewed.  Constitutional:      Appearance: He is well-developed.  Pulmonary:     Effort: Pulmonary effort is normal.  Musculoskeletal:        General: Normal range of motion.  Skin:    General: Skin is warm.  Neurological:     Mental Status: He is alert.     Neurovascular status intact muscle strength was found to be adequate range of motion within normal limits.  Patient is noted to have direct pain in the center of the right heel with inflammation fluid buildup around the medial band with moderate depression of the arch.  Patient is noted to have good digital perfusion and is well oriented x3     Assessment:  Acute plantar fasciitis right with inflammation fluid of the medial band at the insertional point tendon calcaneus     Plan:  H&P condition reviewed and I went ahead today did sterile prep and injected the plantar fascial 3 mg Kenalog 5 mg Xylocaine and applied fascial brace to lift up the arch.  Gave instructions for supportive shoes and reappoint to recheck  X-rays indicate there is no indications of plantar spur stress fracture with mild depression of the arch

## 2019-03-02 ENCOUNTER — Other Ambulatory Visit: Payer: Self-pay | Admitting: Podiatry

## 2019-03-02 DIAGNOSIS — M722 Plantar fascial fibromatosis: Secondary | ICD-10-CM

## 2019-03-15 ENCOUNTER — Ambulatory Visit: Payer: Medicare HMO | Admitting: Podiatry

## 2019-03-15 ENCOUNTER — Other Ambulatory Visit: Payer: Self-pay

## 2019-03-15 ENCOUNTER — Encounter: Payer: Self-pay | Admitting: Podiatry

## 2019-03-15 DIAGNOSIS — M722 Plantar fascial fibromatosis: Secondary | ICD-10-CM

## 2019-03-16 NOTE — Progress Notes (Signed)
Subjective:   Patient ID: Robert Morrison, male   DOB: 64 y.o.   MRN: [id number]   HPI Patient presents stating he is doing significantly better with minimal discomfort and states he is walking with a good heel toe gait at the current time   ROS      Objective:  Physical Exam  Neurovascular status intact with significant diminishment of edema plantar aspect heel with pain only upon deep palpation with no superficial pain noted     Assessment:  Doing very well post fascial treatment right with both brace shoe gear modifications medication     Plan:  H&P and I reviewed the condition with him and we discussed the continuation of the physical therapy anti-inflammatories and support therapy.  Patient is discharged and will be seen back to recheck

## 2019-05-25 DIAGNOSIS — R972 Elevated prostate specific antigen [PSA]: Secondary | ICD-10-CM | POA: Diagnosis not present

## 2019-05-25 DIAGNOSIS — D869 Sarcoidosis, unspecified: Secondary | ICD-10-CM | POA: Diagnosis not present

## 2019-05-25 DIAGNOSIS — E663 Overweight: Secondary | ICD-10-CM | POA: Diagnosis not present

## 2019-05-25 DIAGNOSIS — M1990 Unspecified osteoarthritis, unspecified site: Secondary | ICD-10-CM | POA: Diagnosis not present

## 2019-05-25 DIAGNOSIS — K219 Gastro-esophageal reflux disease without esophagitis: Secondary | ICD-10-CM | POA: Diagnosis not present

## 2019-05-25 DIAGNOSIS — R739 Hyperglycemia, unspecified: Secondary | ICD-10-CM | POA: Diagnosis not present

## 2019-05-25 DIAGNOSIS — Z23 Encounter for immunization: Secondary | ICD-10-CM | POA: Diagnosis not present

## 2019-05-25 DIAGNOSIS — R21 Rash and other nonspecific skin eruption: Secondary | ICD-10-CM | POA: Diagnosis not present

## 2019-05-25 DIAGNOSIS — N1831 Chronic kidney disease, stage 3a: Secondary | ICD-10-CM | POA: Diagnosis not present

## 2019-05-25 DIAGNOSIS — R030 Elevated blood-pressure reading, without diagnosis of hypertension: Secondary | ICD-10-CM | POA: Diagnosis not present

## 2019-06-21 DIAGNOSIS — R69 Illness, unspecified: Secondary | ICD-10-CM | POA: Diagnosis not present

## 2019-08-07 DIAGNOSIS — Z1283 Encounter for screening for malignant neoplasm of skin: Secondary | ICD-10-CM | POA: Diagnosis not present

## 2019-08-07 DIAGNOSIS — D225 Melanocytic nevi of trunk: Secondary | ICD-10-CM | POA: Diagnosis not present

## 2019-08-07 DIAGNOSIS — L570 Actinic keratosis: Secondary | ICD-10-CM | POA: Diagnosis not present

## 2019-08-07 DIAGNOSIS — X32XXXD Exposure to sunlight, subsequent encounter: Secondary | ICD-10-CM | POA: Diagnosis not present

## 2019-09-04 DIAGNOSIS — R69 Illness, unspecified: Secondary | ICD-10-CM | POA: Diagnosis not present

## 2019-10-18 DIAGNOSIS — R739 Hyperglycemia, unspecified: Secondary | ICD-10-CM | POA: Diagnosis not present

## 2019-10-18 DIAGNOSIS — Z125 Encounter for screening for malignant neoplasm of prostate: Secondary | ICD-10-CM | POA: Diagnosis not present

## 2019-10-18 DIAGNOSIS — N1831 Chronic kidney disease, stage 3a: Secondary | ICD-10-CM | POA: Diagnosis not present

## 2019-10-18 DIAGNOSIS — E785 Hyperlipidemia, unspecified: Secondary | ICD-10-CM | POA: Diagnosis not present

## 2019-10-21 DIAGNOSIS — R69 Illness, unspecified: Secondary | ICD-10-CM | POA: Diagnosis not present

## 2019-10-25 DIAGNOSIS — E663 Overweight: Secondary | ICD-10-CM | POA: Diagnosis not present

## 2019-10-25 DIAGNOSIS — R972 Elevated prostate specific antigen [PSA]: Secondary | ICD-10-CM | POA: Diagnosis not present

## 2019-10-25 DIAGNOSIS — E785 Hyperlipidemia, unspecified: Secondary | ICD-10-CM | POA: Diagnosis not present

## 2019-10-25 DIAGNOSIS — E1122 Type 2 diabetes mellitus with diabetic chronic kidney disease: Secondary | ICD-10-CM | POA: Diagnosis not present

## 2019-10-25 DIAGNOSIS — N1831 Chronic kidney disease, stage 3a: Secondary | ICD-10-CM | POA: Diagnosis not present

## 2019-10-25 DIAGNOSIS — Z1212 Encounter for screening for malignant neoplasm of rectum: Secondary | ICD-10-CM | POA: Diagnosis not present

## 2019-10-25 DIAGNOSIS — K429 Umbilical hernia without obstruction or gangrene: Secondary | ICD-10-CM | POA: Diagnosis not present

## 2019-10-25 DIAGNOSIS — Z0000 Encounter for general adult medical examination without abnormal findings: Secondary | ICD-10-CM | POA: Diagnosis not present

## 2019-10-25 DIAGNOSIS — R030 Elevated blood-pressure reading, without diagnosis of hypertension: Secondary | ICD-10-CM | POA: Diagnosis not present

## 2019-10-25 DIAGNOSIS — M1990 Unspecified osteoarthritis, unspecified site: Secondary | ICD-10-CM | POA: Diagnosis not present

## 2019-10-25 DIAGNOSIS — D869 Sarcoidosis, unspecified: Secondary | ICD-10-CM | POA: Diagnosis not present

## 2019-10-25 DIAGNOSIS — R82998 Other abnormal findings in urine: Secondary | ICD-10-CM | POA: Diagnosis not present

## 2020-01-03 DIAGNOSIS — H5203 Hypermetropia, bilateral: Secondary | ICD-10-CM | POA: Diagnosis not present

## 2020-03-26 ENCOUNTER — Encounter: Payer: Self-pay | Admitting: Internal Medicine

## 2020-04-01 DIAGNOSIS — H6123 Impacted cerumen, bilateral: Secondary | ICD-10-CM | POA: Diagnosis not present

## 2020-04-01 DIAGNOSIS — H9313 Tinnitus, bilateral: Secondary | ICD-10-CM | POA: Diagnosis not present

## 2020-04-18 ENCOUNTER — Encounter: Payer: Self-pay | Admitting: Internal Medicine

## 2020-05-09 DIAGNOSIS — N1831 Chronic kidney disease, stage 3a: Secondary | ICD-10-CM | POA: Diagnosis not present

## 2020-05-09 DIAGNOSIS — E785 Hyperlipidemia, unspecified: Secondary | ICD-10-CM | POA: Diagnosis not present

## 2020-05-09 DIAGNOSIS — R030 Elevated blood-pressure reading, without diagnosis of hypertension: Secondary | ICD-10-CM | POA: Diagnosis not present

## 2020-05-09 DIAGNOSIS — D869 Sarcoidosis, unspecified: Secondary | ICD-10-CM | POA: Diagnosis not present

## 2020-05-09 DIAGNOSIS — K429 Umbilical hernia without obstruction or gangrene: Secondary | ICD-10-CM | POA: Diagnosis not present

## 2020-05-09 DIAGNOSIS — R972 Elevated prostate specific antigen [PSA]: Secondary | ICD-10-CM | POA: Diagnosis not present

## 2020-05-09 DIAGNOSIS — K635 Polyp of colon: Secondary | ICD-10-CM | POA: Diagnosis not present

## 2020-05-09 DIAGNOSIS — E1122 Type 2 diabetes mellitus with diabetic chronic kidney disease: Secondary | ICD-10-CM | POA: Diagnosis not present

## 2020-05-09 DIAGNOSIS — E663 Overweight: Secondary | ICD-10-CM | POA: Diagnosis not present

## 2020-06-10 ENCOUNTER — Other Ambulatory Visit: Payer: Self-pay

## 2020-06-10 ENCOUNTER — Ambulatory Visit (AMBULATORY_SURGERY_CENTER): Payer: Self-pay

## 2020-06-10 VITALS — Ht 66.0 in | Wt 179.0 lb

## 2020-06-10 DIAGNOSIS — Z86010 Personal history of colonic polyps: Secondary | ICD-10-CM

## 2020-06-10 MED ORDER — SUTAB 1479-225-188 MG PO TABS: 1.0000 | ORAL_TABLET | ORAL | 0 refills | Status: DC

## 2020-06-10 NOTE — Progress Notes (Signed)
No egg or soy allergy known to patient  No issues with past sedation with any surgeries or procedures Patient denies ever being told they had issues or difficulty with intubation  No FH of Malignant Hyperthermia No diet pills per patient No home 02 use per patient  No blood thinners per patient  Pt denies issues with constipation  No A fib or A flutter  EMMI video via MyChart  COVID 19 guidelines implemented in PV today with Pt and RN   Coupon given to pt in PV today, Code to Pharmacy and NO PA's for preps discussed with pt in PV today  Discussed with pt there will be an out-of-pocket cost for prep and that varies from $0 to 70 dollars   Due to the COVID-19 pandemic we are asking patients to follow certain guidelines.  Pt aware of COVID protocols and [facility] guidelines   

## 2020-06-20 ENCOUNTER — Encounter: Payer: Self-pay | Admitting: Internal Medicine

## 2020-06-23 ENCOUNTER — Ambulatory Visit (AMBULATORY_SURGERY_CENTER): Payer: Medicare HMO | Admitting: Internal Medicine

## 2020-06-23 ENCOUNTER — Other Ambulatory Visit: Payer: Self-pay

## 2020-06-23 ENCOUNTER — Encounter: Payer: Self-pay | Admitting: Internal Medicine

## 2020-06-23 ENCOUNTER — Other Ambulatory Visit: Payer: Self-pay | Admitting: Internal Medicine

## 2020-06-23 VITALS — BP 113/81 | HR 67 | Temp 98.9°F | Resp 11 | Ht 66.0 in | Wt 179.0 lb

## 2020-06-23 DIAGNOSIS — Z86010 Personal history of colonic polyps: Secondary | ICD-10-CM | POA: Diagnosis not present

## 2020-06-23 DIAGNOSIS — K635 Polyp of colon: Secondary | ICD-10-CM | POA: Diagnosis not present

## 2020-06-23 DIAGNOSIS — D120 Benign neoplasm of cecum: Secondary | ICD-10-CM

## 2020-06-23 MED ORDER — SODIUM CHLORIDE 0.9 % IV SOLN: 500.0000 mL | Freq: Once | INTRAVENOUS | Status: DC

## 2020-06-23 NOTE — Progress Notes (Signed)
C.W. vital signs. 

## 2020-06-23 NOTE — Patient Instructions (Signed)
Please read handouts provided. Continue present medications. Await pathology results.   YOU HAD AN ENDOSCOPIC PROCEDURE TODAY AT THE [facility]:   Refer to the procedure report that was given to you for any specific questions about what was found during the examination.  If the procedure report does not answer your questions, please call your gastroenterologist to clarify.  If you requested that your care partner not be given the details of your procedure findings, then the procedure report has been included in a sealed envelope for you to review at your convenience later.  YOU SHOULD EXPECT: Some feelings of bloating in the abdomen. Passage of more gas than usual.  Walking can help get rid of the air that was put into your GI tract during the procedure and reduce the bloating. If you had a lower endoscopy (such as a colonoscopy or flexible sigmoidoscopy) you may notice spotting of blood in your stool or on the toilet paper. If you underwent a bowel prep for your procedure, you may not have a normal bowel movement for a few days.  Please Note:  You might notice some irritation and congestion in your nose or some drainage.  This is from the oxygen used during your procedure.  There is no need for concern and it should clear up in a day or so.  SYMPTOMS TO REPORT IMMEDIATELY:  Following lower endoscopy (colonoscopy or flexible sigmoidoscopy):  Excessive amounts of blood in the stool  Significant tenderness or worsening of abdominal pains  Swelling of the abdomen that is new, acute  Fever of 100F or higher   For urgent or emergent issues, a gastroenterologist can be reached at any hour by calling [phone]. Do not use MyChart messaging for urgent concerns.    DIET:  We do recommend a small meal at first, but then you may proceed to your regular diet.  Drink plenty of fluids but you should avoid alcoholic beverages for 24 hours.  ACTIVITY:  You should plan to take it easy  for the rest of today and you should NOT DRIVE or use heavy machinery until tomorrow (because of the sedation medicines used during the test).    FOLLOW UP: Our staff will call the number listed on your records 48-72 hours following your procedure to check on you and address any questions or concerns that you may have regarding the information given to you following your procedure. If we do not reach you, we will leave a message.  We will attempt to reach you two times.  During this call, we will ask if you have developed any symptoms of COVID 19. If you develop any symptoms (ie: fever, flu-like symptoms, shortness of breath, cough etc.) before then, please call [phone].  If you test positive for Covid 19 in the 2 weeks post procedure, please call and report this information to us.    If any biopsies were taken you will be contacted by phone or by letter within the next 1-3 weeks.  Please call us at [phone] if you have not heard about the biopsies in 3 weeks.    SIGNATURES/CONFIDENTIALITY: You and/or your care partner have signed paperwork which will be entered into your electronic medical record.  These signatures attest to the fact that that the information above on your After Visit Summary has been reviewed and is understood.  Full responsibility of the confidentiality of this discharge information lies with you and/or your care-partner.  

## 2020-06-23 NOTE — Progress Notes (Signed)
Pt's states no medical or surgical changes since previsit or office visit. 

## 2020-06-23 NOTE — Op Note (Signed)
[facility] Patient Name: Robert Morrison Procedure Date: 06/23/2020 2:34 PM MRN: [id number] Endoscopist: Docia Chuck. Henrene Pastor , MD Age: 65 Referring MD:  Date of Birth: 1956/01/18 Gender: Male Account #: [id number] Procedure:                Colonoscopy with cold snare polypectomy x 1 Indications:              High risk colon cancer surveillance: Personal                            history of non-advanced adenomas, High risk colon                            cancer surveillance: Personal history of sessile                            serrated colon polyps (less than 10 mm in size)                            with no dysplasia. Previous examinations 2006,                            2011, 2016 Medicines:                Monitored Anesthesia Care Procedure:                Pre-Anesthesia Assessment:                           - Prior to the procedure, a History and Physical                            was performed, and patient medications and                            allergies were reviewed. The patient's tolerance of                            previous anesthesia was also reviewed. The risks                            and benefits of the procedure and the sedation                            options and risks were discussed with the patient.                            All questions were answered, and informed consent                            was obtained. Prior Anticoagulants: The patient has                            taken no previous anticoagulant or antiplatelet  agents. ASA Grade Assessment: II - A patient with                            mild systemic disease. After reviewing the risks                            and benefits, the patient was deemed in                            satisfactory condition to undergo the procedure.                           After obtaining informed consent, the colonoscope                            was passed under direct  vision. Throughout the                            procedure, the patient's blood pressure, pulse, and                            oxygen saturations were monitored continuously. The                            Olympus CF-HQ190 [id number]) Colonoscope was                            introduced through the anus and advanced to the the                            cecum, identified by appendiceal orifice and                            ileocecal valve. The ileocecal valve, appendiceal                            orifice, and rectum were photographed. The quality                            of the bowel preparation was excellent. The                            colonoscopy was performed without difficulty. The                            patient tolerated the procedure well. The bowel                            preparation used was SUPREP via split dose                            instruction. Scope In: 2:38:56 PM Scope Out: 2:50:57 PM Scope Withdrawal Time: 0 hours 10 minutes 14 seconds  Total Procedure Duration: 0 hours 12 minutes  1 second  Findings:                 A 5 mm polyp was found in the cecum. The polyp was                            removed with a cold snare. Resection and retrieval                            were complete.                           The exam was otherwise without abnormality on                            direct and retroflexion views. Complications:            No immediate complications. Estimated blood loss:                            None. Estimated Blood Loss:     Estimated blood loss: none. Impression:               - One 5 mm polyp in the cecum, removed with a cold                            snare. Resected and retrieved.                           - The examination was otherwise normal on direct                            and retroflexion views. Recommendation:           - Repeat colonoscopy in 5 years for surveillance                            (history of multiple  polyps).                           - Patient has a contact number available for                            emergencies. The signs and symptoms of potential                            delayed complications were discussed with the                            patient. Return to normal activities tomorrow.                            Written discharge instructions were provided to the                            patient.                           -  Resume previous diet.                           - Continue present medications.                           - Await pathology results. Docia Chuck. Henrene Pastor, MD 06/23/2020 3:12:38 PM This report has been signed electronically.

## 2020-06-23 NOTE — Progress Notes (Signed)
PT taken to PACU. Monitors in place. VSS. Report given to RN. 

## 2020-06-23 NOTE — Progress Notes (Signed)
Called to room to assist during endoscopic procedure.  Patient ID and intended procedure confirmed with present staff. Received instructions for my participation in the procedure from the performing physician.  

## 2020-06-25 ENCOUNTER — Telehealth: Payer: Self-pay

## 2020-06-25 NOTE — Telephone Encounter (Signed)
  Follow up Call-  Call back number 06/23/2020  Post procedure Call Back phone  # [phone]  Permission to leave phone message Yes  Some recent data might be hidden     Patient questions:  Do you have a fever, pain , or abdominal swelling? No. Pain Score  0 *  Have you tolerated food without any problems? Yes.    Have you been able to return to your normal activities? Yes.    Do you have any questions about your discharge instructions: Diet   No. Medications  No. Follow up visit  No.  Do you have questions or concerns about your Care? No.  Actions: * If pain score is 4 or above: No action needed, pain <4.  1. Have you developed a fever since your procedure? no  2.   Have you had an respiratory symptoms (SOB or cough) since your procedure? no  3.   Have you tested positive for COVID 19 since your procedure no  4.   Have you had any family members/close contacts diagnosed with the COVID 19 since your procedure?  no   If yes to any of these questions please route to Joylene John, RN and Joella Prince, RN

## 2020-07-10 DIAGNOSIS — K635 Polyp of colon: Secondary | ICD-10-CM | POA: Diagnosis not present

## 2020-07-10 DIAGNOSIS — M1990 Unspecified osteoarthritis, unspecified site: Secondary | ICD-10-CM | POA: Diagnosis not present

## 2020-07-10 DIAGNOSIS — R972 Elevated prostate specific antigen [PSA]: Secondary | ICD-10-CM | POA: Diagnosis not present

## 2020-07-10 DIAGNOSIS — E1122 Type 2 diabetes mellitus with diabetic chronic kidney disease: Secondary | ICD-10-CM | POA: Diagnosis not present

## 2020-07-10 DIAGNOSIS — E663 Overweight: Secondary | ICD-10-CM | POA: Diagnosis not present

## 2020-07-10 DIAGNOSIS — E785 Hyperlipidemia, unspecified: Secondary | ICD-10-CM | POA: Diagnosis not present

## 2020-07-10 DIAGNOSIS — R21 Rash and other nonspecific skin eruption: Secondary | ICD-10-CM | POA: Diagnosis not present

## 2020-07-10 DIAGNOSIS — N1831 Chronic kidney disease, stage 3a: Secondary | ICD-10-CM | POA: Diagnosis not present

## 2020-07-10 DIAGNOSIS — I129 Hypertensive chronic kidney disease with stage 1 through stage 4 chronic kidney disease, or unspecified chronic kidney disease: Secondary | ICD-10-CM | POA: Diagnosis not present

## 2020-07-10 DIAGNOSIS — K429 Umbilical hernia without obstruction or gangrene: Secondary | ICD-10-CM | POA: Diagnosis not present

## 2020-07-14 ENCOUNTER — Encounter: Payer: Self-pay | Admitting: Internal Medicine

## 2020-11-03 DIAGNOSIS — E1122 Type 2 diabetes mellitus with diabetic chronic kidney disease: Secondary | ICD-10-CM | POA: Diagnosis not present

## 2020-11-03 DIAGNOSIS — Z125 Encounter for screening for malignant neoplasm of prostate: Secondary | ICD-10-CM | POA: Diagnosis not present

## 2020-11-03 DIAGNOSIS — E785 Hyperlipidemia, unspecified: Secondary | ICD-10-CM | POA: Diagnosis not present

## 2020-11-10 DIAGNOSIS — Z0000 Encounter for general adult medical examination without abnormal findings: Secondary | ICD-10-CM | POA: Diagnosis not present

## 2020-11-10 DIAGNOSIS — E663 Overweight: Secondary | ICD-10-CM | POA: Diagnosis not present

## 2020-11-10 DIAGNOSIS — E1122 Type 2 diabetes mellitus with diabetic chronic kidney disease: Secondary | ICD-10-CM | POA: Diagnosis not present

## 2020-11-10 DIAGNOSIS — M1990 Unspecified osteoarthritis, unspecified site: Secondary | ICD-10-CM | POA: Diagnosis not present

## 2020-11-10 DIAGNOSIS — R69 Illness, unspecified: Secondary | ICD-10-CM | POA: Diagnosis not present

## 2020-11-10 DIAGNOSIS — Z1389 Encounter for screening for other disorder: Secondary | ICD-10-CM | POA: Diagnosis not present

## 2020-11-10 DIAGNOSIS — Z1331 Encounter for screening for depression: Secondary | ICD-10-CM | POA: Diagnosis not present

## 2020-11-10 DIAGNOSIS — R82998 Other abnormal findings in urine: Secondary | ICD-10-CM | POA: Diagnosis not present

## 2020-11-10 DIAGNOSIS — N1831 Chronic kidney disease, stage 3a: Secondary | ICD-10-CM | POA: Diagnosis not present

## 2020-11-10 DIAGNOSIS — I10 Essential (primary) hypertension: Secondary | ICD-10-CM | POA: Diagnosis not present

## 2020-11-10 DIAGNOSIS — F5221 Male erectile disorder: Secondary | ICD-10-CM | POA: Diagnosis not present

## 2020-11-10 DIAGNOSIS — E785 Hyperlipidemia, unspecified: Secondary | ICD-10-CM | POA: Diagnosis not present

## 2020-11-10 DIAGNOSIS — Z1212 Encounter for screening for malignant neoplasm of rectum: Secondary | ICD-10-CM | POA: Diagnosis not present

## 2020-11-10 DIAGNOSIS — R972 Elevated prostate specific antigen [PSA]: Secondary | ICD-10-CM | POA: Diagnosis not present

## 2020-12-09 DIAGNOSIS — H00022 Hordeolum internum right lower eyelid: Secondary | ICD-10-CM | POA: Diagnosis not present

## 2020-12-24 DIAGNOSIS — D3132 Benign neoplasm of left choroid: Secondary | ICD-10-CM | POA: Diagnosis not present

## 2021-01-19 ENCOUNTER — Ambulatory Visit: Payer: Medicare HMO | Admitting: Orthopaedic Surgery

## 2021-01-19 ENCOUNTER — Ambulatory Visit (INDEPENDENT_AMBULATORY_CARE_PROVIDER_SITE_OTHER): Payer: Medicare HMO

## 2021-01-19 DIAGNOSIS — M25561 Pain in right knee: Secondary | ICD-10-CM

## 2021-01-19 DIAGNOSIS — G8929 Other chronic pain: Secondary | ICD-10-CM

## 2021-01-19 NOTE — Progress Notes (Signed)
Office Visit Note   Patient: Robert Morrison           Date of Birth: 07-15-1955           MRN: [id number] Visit Date: 01/19/2021              Requested by: Shon Baton, [address],  [place] [address] PCP: Shon Baton, MD   Assessment & Plan: Visit Diagnoses:  1. Chronic pain of right knee     Plan: I did offer steroid injection in his right knee today but he is declining this since he is asymptomatic.  I have recommended Voltaren gel.  If he does become symptomatic my next step would be considering a steroid injection.  All question concerns were answered and addressed.  Follow-up is as needed.  Follow-Up Instructions: Return if symptoms worsen or fail to improve.   Orders:  Orders Placed This Encounter  Procedures   XR KNEE 3 VIEW RIGHT   No orders of the defined types were placed in this encounter.     Procedures: No procedures performed   Clinical Data: No additional findings.   Subjective: Chief Complaint  Patient presents with   Right Knee - Pain  The patient is actually well-known to me.  However, its been over 2 years since I have seen him.  He says about a year ago he fell on his right knee when he tripped over his dog.  He has a area that becomes point tender at times just lateral to the patella and the patella tendon with the right knee.  He denies any locking catching he says just certain motions were pushing the spot on the knee can cause him to have a lot of pain.  He is pain-free today.  When he made this appointment last week he was very painful.  He denies any swelling.  He denies any mechanical symptoms or instability of the right knee.  He said he did land on it very hard at the time a year ago.  He said it hurt for several weeks after that and then his pain subsided.  There is otherwise been no acute change in his medical status.  HPI  Review of Systems There is no fever, chills, nausea, vomiting  Objective: Vital Signs: There  were no vitals taken for this visit.  Physical Exam  Ortho Exam Examination of his right knee is entirely normal today.  There is no deficits in range of motion.  There is no effusion.  His knee is ligamentously stable.  His Lachman's and McMurray's exam is negative. Specialty Comments:  No specialty comments available.  Imaging: XR KNEE 3 VIEW RIGHT  Result Date: 01/19/2021 3 views of the right knee show no acute findings.  The joint space is well-maintained.    PMFS History: Patient Active Problem List   Diagnosis Date Noted   Status post arthroscopy of right shoulder 07/04/2017   Impingement syndrome of right shoulder 10/04/2016   Pulmonary sarcoidosis ([place]) 11/28/2015   Cough 11/12/2015   Past Medical History:  Diagnosis Date   GERD (gastroesophageal reflux disease)    uses OTC PRN   History of bronchitis    around 08   History of colon polyps    benign   Hyperlipidemia    on meds   Hypertension    on meds   Pneumonia    hx of couple of yrs ago   PONV (postoperative nausea and vomiting)  Family History  Problem Relation Age of Onset   Colon polyps Sister 72   Colon cancer Neg Hx    Esophageal cancer Neg Hx    Rectal cancer Neg Hx    Stomach cancer Neg Hx     Past Surgical History:  Procedure Laterality Date   COLONOSCOPY  12-05-2009   COLONOSCOPY  2016   JP-MAC-suprep(exc)-SSP x 1   ELBOW FRACTURE SURGERY Left 1998   ENDOBRONCHIAL ULTRASOUND Bilateral 12/08/2015   Procedure: ENDOBRONCHIAL ULTRASOUND;  Surgeon: Juanito Doom, MD;  Location: [facility];  Service: Cardiopulmonary;  Laterality: Bilateral;   MEDIASTINOSCOPY N/A 01/29/2016   Procedure: MEDIASTINOSCOPY;  Surgeon: Melrose Nakayama, MD;  Location: [facility];  Service: Thoracic;  Laterality: N/A;   POLYPECTOMY  2016   SSP x 1   ROTATOR CUFF REPAIR Left 2010   ROTATOR CUFF REPAIR Right 2019   TONSILLECTOMY     Social History   Occupational History   Occupation: retired  Tobacco Use    Smoking status: Never   Smokeless tobacco: Never  Vaping Use   Vaping Use: Never used  Substance and Sexual Activity   Alcohol use: Yes    Comment: rarely ----2 times per year   Drug use: No   Sexual activity: Not on file

## 2021-04-28 DIAGNOSIS — E785 Hyperlipidemia, unspecified: Secondary | ICD-10-CM | POA: Diagnosis not present

## 2021-04-28 DIAGNOSIS — R030 Elevated blood-pressure reading, without diagnosis of hypertension: Secondary | ICD-10-CM | POA: Diagnosis not present

## 2021-04-28 DIAGNOSIS — E1122 Type 2 diabetes mellitus with diabetic chronic kidney disease: Secondary | ICD-10-CM | POA: Diagnosis not present

## 2021-04-28 DIAGNOSIS — I129 Hypertensive chronic kidney disease with stage 1 through stage 4 chronic kidney disease, or unspecified chronic kidney disease: Secondary | ICD-10-CM | POA: Diagnosis not present

## 2021-04-28 DIAGNOSIS — E663 Overweight: Secondary | ICD-10-CM | POA: Diagnosis not present

## 2021-04-28 DIAGNOSIS — R69 Illness, unspecified: Secondary | ICD-10-CM | POA: Diagnosis not present

## 2021-04-28 DIAGNOSIS — K635 Polyp of colon: Secondary | ICD-10-CM | POA: Diagnosis not present

## 2021-04-28 DIAGNOSIS — N1831 Chronic kidney disease, stage 3a: Secondary | ICD-10-CM | POA: Diagnosis not present

## 2021-04-28 DIAGNOSIS — R972 Elevated prostate specific antigen [PSA]: Secondary | ICD-10-CM | POA: Diagnosis not present

## 2021-04-28 DIAGNOSIS — M1990 Unspecified osteoarthritis, unspecified site: Secondary | ICD-10-CM | POA: Diagnosis not present

## 2021-08-27 DIAGNOSIS — H6123 Impacted cerumen, bilateral: Secondary | ICD-10-CM | POA: Diagnosis not present

## 2021-11-24 DIAGNOSIS — R7989 Other specified abnormal findings of blood chemistry: Secondary | ICD-10-CM | POA: Diagnosis not present

## 2021-11-24 DIAGNOSIS — R739 Hyperglycemia, unspecified: Secondary | ICD-10-CM | POA: Diagnosis not present

## 2021-11-24 DIAGNOSIS — I10 Essential (primary) hypertension: Secondary | ICD-10-CM | POA: Diagnosis not present

## 2021-11-24 DIAGNOSIS — E785 Hyperlipidemia, unspecified: Secondary | ICD-10-CM | POA: Diagnosis not present

## 2021-11-24 DIAGNOSIS — Z125 Encounter for screening for malignant neoplasm of prostate: Secondary | ICD-10-CM | POA: Diagnosis not present

## 2021-12-01 DIAGNOSIS — E785 Hyperlipidemia, unspecified: Secondary | ICD-10-CM | POA: Diagnosis not present

## 2021-12-01 DIAGNOSIS — Z0000 Encounter for general adult medical examination without abnormal findings: Secondary | ICD-10-CM | POA: Diagnosis not present

## 2021-12-01 DIAGNOSIS — K635 Polyp of colon: Secondary | ICD-10-CM | POA: Diagnosis not present

## 2021-12-01 DIAGNOSIS — E1122 Type 2 diabetes mellitus with diabetic chronic kidney disease: Secondary | ICD-10-CM | POA: Diagnosis not present

## 2021-12-01 DIAGNOSIS — D869 Sarcoidosis, unspecified: Secondary | ICD-10-CM | POA: Diagnosis not present

## 2021-12-01 DIAGNOSIS — N1831 Chronic kidney disease, stage 3a: Secondary | ICD-10-CM | POA: Diagnosis not present

## 2021-12-01 DIAGNOSIS — R82998 Other abnormal findings in urine: Secondary | ICD-10-CM | POA: Diagnosis not present

## 2021-12-01 DIAGNOSIS — E663 Overweight: Secondary | ICD-10-CM | POA: Diagnosis not present

## 2021-12-01 DIAGNOSIS — I129 Hypertensive chronic kidney disease with stage 1 through stage 4 chronic kidney disease, or unspecified chronic kidney disease: Secondary | ICD-10-CM | POA: Diagnosis not present

## 2021-12-01 DIAGNOSIS — R972 Elevated prostate specific antigen [PSA]: Secondary | ICD-10-CM | POA: Diagnosis not present

## 2021-12-25 DIAGNOSIS — Z0100 Encounter for examination of eyes and vision without abnormal findings: Secondary | ICD-10-CM | POA: Diagnosis not present

## 2021-12-25 DIAGNOSIS — D3132 Benign neoplasm of left choroid: Secondary | ICD-10-CM | POA: Diagnosis not present

## 2022-06-07 DIAGNOSIS — R972 Elevated prostate specific antigen [PSA]: Secondary | ICD-10-CM | POA: Diagnosis not present

## 2022-06-07 DIAGNOSIS — D869 Sarcoidosis, unspecified: Secondary | ICD-10-CM | POA: Diagnosis not present

## 2022-06-07 DIAGNOSIS — E663 Overweight: Secondary | ICD-10-CM | POA: Diagnosis not present

## 2022-06-07 DIAGNOSIS — M1990 Unspecified osteoarthritis, unspecified site: Secondary | ICD-10-CM | POA: Diagnosis not present

## 2022-06-07 DIAGNOSIS — I129 Hypertensive chronic kidney disease with stage 1 through stage 4 chronic kidney disease, or unspecified chronic kidney disease: Secondary | ICD-10-CM | POA: Diagnosis not present

## 2022-06-07 DIAGNOSIS — N1831 Chronic kidney disease, stage 3a: Secondary | ICD-10-CM | POA: Diagnosis not present

## 2022-06-07 DIAGNOSIS — E1122 Type 2 diabetes mellitus with diabetic chronic kidney disease: Secondary | ICD-10-CM | POA: Diagnosis not present

## 2022-06-07 DIAGNOSIS — E785 Hyperlipidemia, unspecified: Secondary | ICD-10-CM | POA: Diagnosis not present

## 2022-06-07 DIAGNOSIS — I700 Atherosclerosis of aorta: Secondary | ICD-10-CM | POA: Diagnosis not present

## 2022-11-09 DIAGNOSIS — L821 Other seborrheic keratosis: Secondary | ICD-10-CM | POA: Diagnosis not present

## 2022-11-09 DIAGNOSIS — D485 Neoplasm of uncertain behavior of skin: Secondary | ICD-10-CM | POA: Diagnosis not present

## 2022-11-09 DIAGNOSIS — C44519 Basal cell carcinoma of skin of other part of trunk: Secondary | ICD-10-CM | POA: Diagnosis not present

## 2022-11-09 DIAGNOSIS — D225 Melanocytic nevi of trunk: Secondary | ICD-10-CM | POA: Diagnosis not present

## 2022-12-02 DIAGNOSIS — H18413 Arcus senilis, bilateral: Secondary | ICD-10-CM | POA: Diagnosis not present

## 2022-12-02 DIAGNOSIS — H02834 Dermatochalasis of left upper eyelid: Secondary | ICD-10-CM | POA: Diagnosis not present

## 2022-12-02 DIAGNOSIS — D3132 Benign neoplasm of left choroid: Secondary | ICD-10-CM | POA: Diagnosis not present

## 2022-12-02 DIAGNOSIS — H02831 Dermatochalasis of right upper eyelid: Secondary | ICD-10-CM | POA: Diagnosis not present

## 2022-12-02 DIAGNOSIS — E119 Type 2 diabetes mellitus without complications: Secondary | ICD-10-CM | POA: Diagnosis not present

## 2022-12-09 DIAGNOSIS — I129 Hypertensive chronic kidney disease with stage 1 through stage 4 chronic kidney disease, or unspecified chronic kidney disease: Secondary | ICD-10-CM | POA: Diagnosis not present

## 2022-12-09 DIAGNOSIS — Z1339 Encounter for screening examination for other mental health and behavioral disorders: Secondary | ICD-10-CM | POA: Diagnosis not present

## 2022-12-09 DIAGNOSIS — Z0000 Encounter for general adult medical examination without abnormal findings: Secondary | ICD-10-CM | POA: Diagnosis not present

## 2022-12-09 DIAGNOSIS — E663 Overweight: Secondary | ICD-10-CM | POA: Diagnosis not present

## 2022-12-09 DIAGNOSIS — K219 Gastro-esophageal reflux disease without esophagitis: Secondary | ICD-10-CM | POA: Diagnosis not present

## 2022-12-09 DIAGNOSIS — I700 Atherosclerosis of aorta: Secondary | ICD-10-CM | POA: Diagnosis not present

## 2022-12-09 DIAGNOSIS — R972 Elevated prostate specific antigen [PSA]: Secondary | ICD-10-CM | POA: Diagnosis not present

## 2022-12-09 DIAGNOSIS — N1831 Chronic kidney disease, stage 3a: Secondary | ICD-10-CM | POA: Diagnosis not present

## 2022-12-09 DIAGNOSIS — E1122 Type 2 diabetes mellitus with diabetic chronic kidney disease: Secondary | ICD-10-CM | POA: Diagnosis not present

## 2022-12-09 DIAGNOSIS — E785 Hyperlipidemia, unspecified: Secondary | ICD-10-CM | POA: Diagnosis not present

## 2022-12-09 DIAGNOSIS — Z1331 Encounter for screening for depression: Secondary | ICD-10-CM | POA: Diagnosis not present

## 2022-12-09 DIAGNOSIS — D869 Sarcoidosis, unspecified: Secondary | ICD-10-CM | POA: Diagnosis not present

## 2022-12-20 DIAGNOSIS — H6123 Impacted cerumen, bilateral: Secondary | ICD-10-CM | POA: Diagnosis not present

## 2022-12-22 DIAGNOSIS — Z08 Encounter for follow-up examination after completed treatment for malignant neoplasm: Secondary | ICD-10-CM | POA: Diagnosis not present

## 2022-12-22 DIAGNOSIS — Z85828 Personal history of other malignant neoplasm of skin: Secondary | ICD-10-CM | POA: Diagnosis not present

## 2023-02-28 DIAGNOSIS — K635 Polyp of colon: Secondary | ICD-10-CM | POA: Diagnosis not present

## 2023-02-28 DIAGNOSIS — K649 Unspecified hemorrhoids: Secondary | ICD-10-CM | POA: Diagnosis not present

## 2023-02-28 DIAGNOSIS — K625 Hemorrhage of anus and rectum: Secondary | ICD-10-CM | POA: Diagnosis not present

## 2023-04-12 DIAGNOSIS — K635 Polyp of colon: Secondary | ICD-10-CM | POA: Diagnosis not present

## 2023-04-12 DIAGNOSIS — R972 Elevated prostate specific antigen [PSA]: Secondary | ICD-10-CM | POA: Diagnosis not present

## 2023-04-12 DIAGNOSIS — E785 Hyperlipidemia, unspecified: Secondary | ICD-10-CM | POA: Diagnosis not present

## 2023-04-12 DIAGNOSIS — N1831 Chronic kidney disease, stage 3a: Secondary | ICD-10-CM | POA: Diagnosis not present

## 2023-04-12 DIAGNOSIS — J45909 Unspecified asthma, uncomplicated: Secondary | ICD-10-CM | POA: Diagnosis not present

## 2023-04-12 DIAGNOSIS — I129 Hypertensive chronic kidney disease with stage 1 through stage 4 chronic kidney disease, or unspecified chronic kidney disease: Secondary | ICD-10-CM | POA: Diagnosis not present

## 2023-04-12 DIAGNOSIS — K219 Gastro-esophageal reflux disease without esophagitis: Secondary | ICD-10-CM | POA: Diagnosis not present

## 2023-04-12 DIAGNOSIS — R030 Elevated blood-pressure reading, without diagnosis of hypertension: Secondary | ICD-10-CM | POA: Diagnosis not present

## 2023-04-12 DIAGNOSIS — D869 Sarcoidosis, unspecified: Secondary | ICD-10-CM | POA: Diagnosis not present

## 2023-04-12 DIAGNOSIS — K649 Unspecified hemorrhoids: Secondary | ICD-10-CM | POA: Diagnosis not present

## 2023-04-12 DIAGNOSIS — E663 Overweight: Secondary | ICD-10-CM | POA: Diagnosis not present

## 2023-04-12 DIAGNOSIS — K625 Hemorrhage of anus and rectum: Secondary | ICD-10-CM | POA: Diagnosis not present

## 2023-04-12 DIAGNOSIS — I700 Atherosclerosis of aorta: Secondary | ICD-10-CM | POA: Diagnosis not present

## 2023-04-12 DIAGNOSIS — M25562 Pain in left knee: Secondary | ICD-10-CM | POA: Diagnosis not present

## 2023-04-12 DIAGNOSIS — E1122 Type 2 diabetes mellitus with diabetic chronic kidney disease: Secondary | ICD-10-CM | POA: Diagnosis not present

## 2023-04-22 ENCOUNTER — Other Ambulatory Visit: Payer: Self-pay | Admitting: Urology

## 2023-04-22 DIAGNOSIS — R972 Elevated prostate specific antigen [PSA]: Secondary | ICD-10-CM

## 2023-06-07 ENCOUNTER — Ambulatory Visit
Admission: RE | Admit: 2023-06-07 | Discharge: 2023-06-07 | Disposition: A | Discharge status: Home / Self Care | Source: Ambulatory Visit | Attending: Urology | Admitting: Urology

## 2023-06-07 DIAGNOSIS — R972 Elevated prostate specific antigen [PSA]: Secondary | ICD-10-CM

## 2023-06-07 MED ORDER — GADOPICLENOL 0.5 MMOL/ML IV SOLN
8.0000 mL | Freq: Once | INTRAVENOUS | Status: AC | PRN
  Administered 2023-06-07: 8 mL via INTRAVENOUS

## 2023-08-10 DIAGNOSIS — D225 Melanocytic nevi of trunk: Secondary | ICD-10-CM | POA: Diagnosis not present

## 2023-08-10 DIAGNOSIS — L821 Other seborrheic keratosis: Secondary | ICD-10-CM | POA: Diagnosis not present

## 2023-08-10 DIAGNOSIS — L570 Actinic keratosis: Secondary | ICD-10-CM | POA: Diagnosis not present

## 2023-08-10 DIAGNOSIS — X32XXXD Exposure to sunlight, subsequent encounter: Secondary | ICD-10-CM | POA: Diagnosis not present

## 2023-09-21 NOTE — Telephone Encounter (Signed)
 Robert Morrison

## 2023-11-07 DIAGNOSIS — H43811 Vitreous degeneration, right eye: Secondary | ICD-10-CM | POA: Diagnosis not present

## 2023-12-01 NOTE — Progress Notes (Signed)
 Robert Morrison                                          MRN: [id number]   12/01/2023   The [facility] Quality Team Specialist reviewed this patient medical record for the purposes of chart review for care gap closure. The following were reviewed: chart review for care gap closure-kidney health evaluation for diabetes:eGFR  and uACR.    [facility] Quality Team

## 2023-12-06 DIAGNOSIS — H43811 Vitreous degeneration, right eye: Secondary | ICD-10-CM | POA: Diagnosis not present

## 2023-12-06 DIAGNOSIS — E119 Type 2 diabetes mellitus without complications: Secondary | ICD-10-CM | POA: Diagnosis not present

## 2023-12-06 DIAGNOSIS — D3132 Benign neoplasm of left choroid: Secondary | ICD-10-CM | POA: Diagnosis not present

## 2023-12-20 DIAGNOSIS — E785 Hyperlipidemia, unspecified: Secondary | ICD-10-CM | POA: Diagnosis not present

## 2023-12-20 DIAGNOSIS — Z1212 Encounter for screening for malignant neoplasm of rectum: Secondary | ICD-10-CM | POA: Diagnosis not present

## 2023-12-20 DIAGNOSIS — I129 Hypertensive chronic kidney disease with stage 1 through stage 4 chronic kidney disease, or unspecified chronic kidney disease: Secondary | ICD-10-CM | POA: Diagnosis not present

## 2023-12-20 DIAGNOSIS — R972 Elevated prostate specific antigen [PSA]: Secondary | ICD-10-CM | POA: Diagnosis not present

## 2023-12-27 DIAGNOSIS — Z1331 Encounter for screening for depression: Secondary | ICD-10-CM | POA: Diagnosis not present

## 2023-12-27 DIAGNOSIS — I700 Atherosclerosis of aorta: Secondary | ICD-10-CM | POA: Diagnosis not present

## 2023-12-27 DIAGNOSIS — Z1389 Encounter for screening for other disorder: Secondary | ICD-10-CM | POA: Diagnosis not present

## 2023-12-27 DIAGNOSIS — R972 Elevated prostate specific antigen [PSA]: Secondary | ICD-10-CM | POA: Diagnosis not present

## 2023-12-27 DIAGNOSIS — N1831 Chronic kidney disease, stage 3a: Secondary | ICD-10-CM | POA: Diagnosis not present

## 2023-12-27 DIAGNOSIS — I129 Hypertensive chronic kidney disease with stage 1 through stage 4 chronic kidney disease, or unspecified chronic kidney disease: Secondary | ICD-10-CM | POA: Diagnosis not present

## 2023-12-27 DIAGNOSIS — R350 Frequency of micturition: Secondary | ICD-10-CM | POA: Diagnosis not present

## 2023-12-27 DIAGNOSIS — E1122 Type 2 diabetes mellitus with diabetic chronic kidney disease: Secondary | ICD-10-CM | POA: Diagnosis not present

## 2023-12-27 DIAGNOSIS — K429 Umbilical hernia without obstruction or gangrene: Secondary | ICD-10-CM | POA: Diagnosis not present

## 2023-12-27 DIAGNOSIS — E785 Hyperlipidemia, unspecified: Secondary | ICD-10-CM | POA: Diagnosis not present

## 2023-12-27 DIAGNOSIS — R82998 Other abnormal findings in urine: Secondary | ICD-10-CM | POA: Diagnosis not present

## 2023-12-27 DIAGNOSIS — Z0000 Encounter for general adult medical examination without abnormal findings: Secondary | ICD-10-CM | POA: Diagnosis not present

## 2023-12-27 DIAGNOSIS — I10 Essential (primary) hypertension: Secondary | ICD-10-CM | POA: Diagnosis not present

## 2023-12-27 DIAGNOSIS — D869 Sarcoidosis, unspecified: Secondary | ICD-10-CM | POA: Diagnosis not present
